# Patient Record
Sex: Female | Born: 1955 | Race: White | Hispanic: No | Smoking: Never smoker
Health system: Southern US, Community
[De-identification: ages and names within clinical notes are randomized; demographics above are authoritative.]

## PROBLEM LIST (undated history)

## (undated) DIAGNOSIS — Z9889 Other specified postprocedural states: Secondary | ICD-10-CM

## (undated) DIAGNOSIS — F32A Depression, unspecified: Secondary | ICD-10-CM

## (undated) DIAGNOSIS — R112 Nausea with vomiting, unspecified: Secondary | ICD-10-CM

## (undated) DIAGNOSIS — F419 Anxiety disorder, unspecified: Secondary | ICD-10-CM

## (undated) DIAGNOSIS — C801 Malignant (primary) neoplasm, unspecified: Secondary | ICD-10-CM

## (undated) HISTORY — PX: APPENDECTOMY: SHX54

## (undated) HISTORY — PX: HERNIA REPAIR: SHX51

## (undated) HISTORY — DX: Malignant (primary) neoplasm, unspecified: C80.1

## (undated) HISTORY — PX: ABDOMINAL HYSTERECTOMY: SHX81

## (undated) HISTORY — PX: REPLACEMENT TOTAL KNEE: SUR1224

## (undated) HISTORY — PX: TONSILLECTOMY: SUR1361

---

## 2008-10-19 HISTORY — PX: INCISIONAL HERNIA REPAIR: SHX193

## 2020-11-04 ENCOUNTER — Ambulatory Visit: Payer: Self-pay | Admitting: Internal Medicine

## 2020-11-08 ENCOUNTER — Other Ambulatory Visit: Payer: Self-pay

## 2020-11-08 ENCOUNTER — Encounter: Payer: Self-pay | Admitting: Internal Medicine

## 2020-11-08 ENCOUNTER — Ambulatory Visit (INDEPENDENT_AMBULATORY_CARE_PROVIDER_SITE_OTHER): Payer: Medicare Other | Admitting: Internal Medicine

## 2020-11-08 VITALS — BP 113/73 | HR 78 | Temp 97.3°F | Ht 63.0 in | Wt 165.0 lb

## 2020-11-08 DIAGNOSIS — Z96652 Presence of left artificial knee joint: Secondary | ICD-10-CM | POA: Diagnosis not present

## 2020-11-08 DIAGNOSIS — K432 Incisional hernia without obstruction or gangrene: Secondary | ICD-10-CM

## 2020-11-08 DIAGNOSIS — Z7689 Persons encountering health services in other specified circumstances: Secondary | ICD-10-CM | POA: Diagnosis not present

## 2020-11-08 DIAGNOSIS — Z8543 Personal history of malignant neoplasm of ovary: Secondary | ICD-10-CM | POA: Diagnosis not present

## 2020-11-08 DIAGNOSIS — F419 Anxiety disorder, unspecified: Secondary | ICD-10-CM | POA: Diagnosis not present

## 2020-11-08 DIAGNOSIS — Z2821 Immunization not carried out because of patient refusal: Secondary | ICD-10-CM

## 2020-11-08 NOTE — Assessment & Plan Note (Signed)
Around 2010 S/p hysterectomy and ovarian resection Had metastatic disease according to history Had chemotherapy for 1 year 

## 2020-11-08 NOTE — Progress Notes (Signed)
New Patient Office Visit  Subjective:  Patient ID: Evelyn Le, female    DOB: July 14, 1956  Age: 65 y.o. MRN: [id number]  CC:  Chief Complaint  Patient presents with  . New Patient (Initial Visit)    Here to establish care. No complaints today.    HPI Evelyn Le is a 65 year old female with PMH of ovarian cancer s/p resection and chemotherapy, postoperative incisional hernia s/p repair and anxiety who presents for establishing care. She recently moved from [place]. Her husband is present during the visit, who also had a visit today.  She has a h/o anxiety and takes Lexapro and Ativan. She states that she had ovarian cancer around 10 years ago, and since then, she has had anxiety. She denies depressed mood, suicidal or homicidal ideation.  She has had colonoscopy about 10 years ago.  She denies screening exams - Mammography and vaccines for now.  Past Medical History:  Diagnosis Date  . Cancer [facility])     Past Surgical History:  Procedure Laterality Date  . ABDOMINAL HYSTERECTOMY    . REPLACEMENT TOTAL KNEE      History reviewed. No pertinent family history.  Social History   Socioeconomic History  . Marital status: Married    Spouse name: Not on file  . Number of children: Not on file  . Years of education: Not on file  . Highest education level: Not on file  Occupational History  . Not on file  Tobacco Use  . Smoking status: Never Smoker  . Smokeless tobacco: Never Used  Substance and Sexual Activity  . Alcohol use: Not on file  . Drug use: Not on file  . Sexual activity: Not on file  Other Topics Concern  . Not on file  Social History Narrative  . Not on file   Social Determinants of Health   Financial Resource Strain: Not on file  Food Insecurity: Not on file  Transportation Needs: Not on file  Physical Activity: Not on file  Stress: Not on file  Social Connections: Not on file  Intimate Partner Violence: Not on file     ROS Review of Systems  Constitutional: Negative for chills and fever.  HENT: Negative for congestion, sinus pressure, sinus pain and sore throat.   Eyes: Negative for pain and discharge.  Respiratory: Negative for cough and shortness of breath.   Cardiovascular: Negative for chest pain and palpitations.  Gastrointestinal: Negative for abdominal pain, constipation, diarrhea, nausea and vomiting.  Endocrine: Negative for polydipsia and polyuria.  Genitourinary: Negative for dysuria and hematuria.  Musculoskeletal: Negative for neck pain and neck stiffness.  Skin: Negative for rash.  Neurological: Negative for dizziness and weakness.  Psychiatric/Behavioral: Negative for agitation, behavioral problems and suicidal ideas. The patient is nervous/anxious.     Objective:   Today's Vitals: BP 113/73 (BP Location: Right Arm, Patient Position: Sitting, Cuff Size: Normal)   Pulse 78   Temp (!) 97.3 F (36.3 C) (Temporal)   Ht 5\' 3"  (1.6 m)   Wt 165 lb (74.8 kg)   SpO2 97%   BMI 29.23 kg/m   Physical Exam Vitals reviewed.  Constitutional:      General: She is not in acute distress.    Appearance: She is not diaphoretic.  HENT:     Head: Normocephalic and atraumatic.     Nose: Nose normal.     Mouth/Throat:     Mouth: Mucous membranes are moist.  Eyes:     General: No scleral icterus.  Extraocular Movements: Extraocular movements intact.     Pupils: Pupils are equal, round, and reactive to light.  Cardiovascular:     Rate and Rhythm: Normal rate and regular rhythm.     Pulses: Normal pulses.     Heart sounds: No murmur heard.   Pulmonary:     Breath sounds: Normal breath sounds. No wheezing or rales.  Abdominal:     Palpations: Abdomen is soft.     Tenderness: There is no abdominal tenderness.  Musculoskeletal:     Cervical back: Neck supple. No tenderness.     Right lower leg: No edema.     Left lower leg: No edema.  Skin:    General: Skin is warm.     Findings:  No rash.  Neurological:     General: No focal deficit present.     Mental Status: She is alert and oriented to person, place, and time.  Psychiatric:        Mood and Affect: Mood normal.        Behavior: Behavior normal.     Assessment & Plan:   Problem List Items Addressed This Visit      Other   Encounter to establish care - Primary    Care established Previous chart reviewed History and medications reviewed with the patient      Anxiety    On Lexapro and Ativan Has been on Ativan for about 10 years, takes about 2-3 times in a week      Relevant Medications   escitalopram (LEXAPRO) 20 MG tablet   LORazepam (ATIVAN) 0.5 MG tablet   History of ovarian cancer    Around 2010 S/p hysterectomy and ovarian resection Had metastatic disease according to history Had chemotherapy for 1 year      History of total knee arthroplasty, left    For left knee arthritis No acute complaints      Postoperative incisional hernia    S/p hernia repair No acute complaints currently       Other Visit Diagnoses    Influenza vaccine refused         Refused Mammography and Pneumonia and Shingrix vaccines.  Outpatient Encounter Medications as of 11/08/2020  Medication Sig  . escitalopram (LEXAPRO) 20 MG tablet Take 20 mg by mouth daily.  Marland Kitchen LORazepam (ATIVAN) 0.5 MG tablet Take 0.25 mg by mouth once as needed for anxiety. Take 1/2 tab once daily PRN.   No facility-administered encounter medications on file as of 11/08/2020.    Follow-up: Return in about 4 months (around 03/08/2021).   Lindell Spar, MD

## 2020-11-08 NOTE — Assessment & Plan Note (Signed)
On Lexapro and Ativan Has been on Ativan for about 10 years, takes about 2-3 times in a week

## 2020-11-08 NOTE — Assessment & Plan Note (Signed)
Care established Previous chart reviewed History and medications reviewed with the patient 

## 2020-11-08 NOTE — Assessment & Plan Note (Signed)
S/p hernia repair No acute complaints currently

## 2020-11-08 NOTE — Patient Instructions (Signed)
Please continue to take medications as prescribed.  Please follow heart healthy diet and perform moderate exercise/walking as tolerated, at least 150 mins/week.  You are strongly advised to consider getting screening Mammography. If you decide to proceed, please contact us.

## 2020-11-08 NOTE — Assessment & Plan Note (Signed)
For left knee arthritis No acute complaints

## 2020-11-15 ENCOUNTER — Other Ambulatory Visit: Payer: Self-pay

## 2020-11-15 ENCOUNTER — Ambulatory Visit (INDEPENDENT_AMBULATORY_CARE_PROVIDER_SITE_OTHER): Payer: Medicare Other

## 2020-11-15 VITALS — Ht 63.0 in | Wt 160.0 lb

## 2020-11-15 DIAGNOSIS — Z0000 Encounter for general adult medical examination without abnormal findings: Secondary | ICD-10-CM

## 2020-11-15 NOTE — Patient Instructions (Addendum)
[name] , Thank you for taking time to come for your Medicare Wellness Visit. I appreciate your ongoing commitment to your health goals. Please review the following plan we discussed and let me know if I can assist you in the future.   Screening recommendations/referrals: Colonoscopy: Declined Mammogram: Declined Bone Density: Declined Recommended yearly ophthalmology/optometry visit for glaucoma screening and checkup Recommended yearly dental visit for hygiene and checkup  Vaccinations: Influenza vaccine: Fall 2022 Pneumococcal vaccine: Declined Tdap vaccine: Declined Shingles vaccine: Declined  Advanced directives: No  Conditions/risks identified: None  Next appointment: 03/14/21 @ 8 am   Preventive Care 65 Years and Older, Female Preventive care refers to lifestyle choices and visits with your health care provider that can promote health and wellness. What does preventive care include?  A yearly physical exam. This is also called an annual well check.  Dental exams once or twice a year.  Routine eye exams. Ask your health care provider how often you should have your eyes checked.  Personal lifestyle choices, including:  Daily care of your teeth and gums.  Regular physical activity.  Eating a healthy diet.  Avoiding tobacco and drug use.  Limiting alcohol use.  Practicing safe sex.  Taking low-dose aspirin every day.  Taking vitamin and mineral supplements as recommended by your health care provider. What happens during an annual well check? The services and screenings done by your health care provider during your annual well check will depend on your age, overall health, lifestyle risk factors, and family history of disease. Counseling  Your health care provider may ask you questions about your:  Alcohol use.  Tobacco use.  Drug use.  Emotional well-being.  Home and relationship well-being.  Sexual activity.  Eating habits.  History of  falls.  Memory and ability to understand (cognition).  Work and work Statistician.  Reproductive health. Screening  You may have the following tests or measurements:  Height, weight, and BMI.  Blood pressure.  Lipid and cholesterol levels. These may be checked every 5 years, or more frequently if you are over 76 years old.  Skin check.  Lung cancer screening. You may have this screening every year starting at age 29 if you have a 30-pack-year history of smoking and currently smoke or have quit within the past 15 years.  Fecal occult blood test (FOBT) of the stool. You may have this test every year starting at age 69.  Flexible sigmoidoscopy or colonoscopy. You may have a sigmoidoscopy every 5 years or a colonoscopy every 10 years starting at age 4.  Hepatitis C blood test.  Hepatitis B blood test.  Sexually transmitted disease (STD) testing.  Diabetes screening. This is done by checking your blood sugar (glucose) after you have not eaten for a while (fasting). You may have this done every 1-3 years.  Bone density scan. This is done to screen for osteoporosis. You may have this done starting at age 40.  Mammogram. This may be done every 1-2 years. Talk to your health care provider about how often you should have regular mammograms. Talk with your health care provider about your test results, treatment options, and if necessary, the need for more tests. Vaccines  Your health care provider may recommend certain vaccines, such as:  Influenza vaccine. This is recommended every year.  Tetanus, diphtheria, and acellular pertussis (Tdap, Td) vaccine. You may need a Td booster every 10 years.  Zoster vaccine. You may need this after age 18.  Pneumococcal 13-valent conjugate (PCV13)  vaccine. One dose is recommended after age 86.  Pneumococcal polysaccharide (PPSV23) vaccine. One dose is recommended after age 63. Talk to your health care provider about which screenings and  vaccines you need and how often you need them. This information is not intended to replace advice given to you by your health care provider. Make sure you discuss any questions you have with your health care provider. Document Released: 11/01/2015 Document Revised: 06/24/2016 Document Reviewed: 08/06/2015 Elsevier Interactive Patient Education  2017 [place] Prevention in the Home Falls can cause injuries. They can happen to people of all ages. There are many things you can do to make your home safe and to help prevent falls. What can I do on the outside of my home?  Regularly fix the edges of walkways and driveways and fix any cracks.  Remove anything that might make you trip as you walk through a door, such as a raised step or threshold.  Trim any bushes or trees on the path to your home.  Use bright outdoor lighting.  Clear any walking paths of anything that might make someone trip, such as rocks or tools.  Regularly check to see if handrails are loose or broken. Make sure that both sides of any steps have handrails.  Any raised decks and porches should have guardrails on the edges.  Have any leaves, snow, or ice cleared regularly.  Use sand or salt on walking paths during winter.  Clean up any spills in your garage right away. This includes oil or grease spills. What can I do in the bathroom?  Use night lights.  Install grab bars by the toilet and in the tub and shower. Do not use towel bars as grab bars.  Use non-skid mats or decals in the tub or shower.  If you need to sit down in the shower, use a plastic, non-slip stool.  Keep the floor dry. Clean up any water that spills on the floor as soon as it happens.  Remove soap buildup in the tub or shower regularly.  Attach bath mats securely with double-sided non-slip rug tape.  Do not have throw rugs and other things on the floor that can make you trip. What can I do in the bedroom?  Use night  lights.  Make sure that you have a light by your bed that is easy to reach.  Do not use any sheets or blankets that are too big for your bed. They should not hang down onto the floor.  Have a firm chair that has side arms. You can use this for support while you get dressed.  Do not have throw rugs and other things on the floor that can make you trip. What can I do in the kitchen?  Clean up any spills right away.  Avoid walking on wet floors.  Keep items that you use a lot in easy-to-reach places.  If you need to reach something above you, use a strong step stool that has a grab bar.  Keep electrical cords out of the way.  Do not use floor polish or wax that makes floors slippery. If you must use wax, use non-skid floor wax.  Do not have throw rugs and other things on the floor that can make you trip. What can I do with my stairs?  Do not leave any items on the stairs.  Make sure that there are handrails on both sides of the stairs and use them. Fix handrails that are  broken or loose. Make sure that handrails are as long as the stairways.  Check any carpeting to make sure that it is firmly attached to the stairs. Fix any carpet that is loose or worn.  Avoid having throw rugs at the top or bottom of the stairs. If you do have throw rugs, attach them to the floor with carpet tape.  Make sure that you have a light switch at the top of the stairs and the bottom of the stairs. If you do not have them, ask someone to add them for you. What else can I do to help prevent falls?  Wear shoes that:  Do not have high heels.  Have rubber bottoms.  Are comfortable and fit you well.  Are closed at the toe. Do not wear sandals.  If you use a stepladder:  Make sure that it is fully opened. Do not climb a closed stepladder.  Make sure that both sides of the stepladder are locked into place.  Ask someone to hold it for you, if possible.  Clearly mark and make sure that you can  see:  Any grab bars or handrails.  First and last steps.  Where the edge of each step is.  Use tools that help you move around (mobility aids) if they are needed. These include:  Canes.  Walkers.  Scooters.  Crutches.  Turn on the lights when you go into a dark area. Replace any light bulbs as soon as they burn out.  Set up your furniture so you have a clear path. Avoid moving your furniture around.  If any of your floors are uneven, fix them.  If there are any pets around you, be aware of where they are.  Review your medicines with your doctor. Some medicines can make you feel dizzy. This can increase your chance of falling. Ask your doctor what other things that you can do to help prevent falls. This information is not intended to replace advice given to you by your health care provider. Make sure you discuss any questions you have with your health care provider. Document Released: 08/01/2009 Document Revised: 03/12/2016 Document Reviewed: 11/09/2014 Elsevier Interactive Patient Education  2017 [employer].

## 2020-11-15 NOTE — Progress Notes (Signed)
Subjective:   Evelyn Le is a 65 y.o. female who presents for Medicare Annual (Subsequent) preventive examination.  Review of Systems       Objective:    There were no vitals filed for this visit. There is no height or weight on file to calculate BMI.  No flowsheet data found.  Current Medications (verified) Outpatient Encounter Medications as of 11/15/2020  Medication Sig  . escitalopram (LEXAPRO) 20 MG tablet Take 20 mg by mouth daily.  Marland Kitchen LORazepam (ATIVAN) 0.5 MG tablet Take 0.25 mg by mouth once as needed for anxiety. Take 1/2 tab once daily PRN.   No facility-administered encounter medications on file as of 11/15/2020.    Allergies (verified) Penicillins   History: Past Medical History:  Diagnosis Date  . Cancer [facility])    Past Surgical History:  Procedure Laterality Date  . ABDOMINAL HYSTERECTOMY    . REPLACEMENT TOTAL KNEE     No family history on file. Social History   Socioeconomic History  . Marital status: Married    Spouse name: Not on file  . Number of children: Not on file  . Years of education: Not on file  . Highest education level: Not on file  Occupational History  . Not on file  Tobacco Use  . Smoking status: Never Smoker  . Smokeless tobacco: Never Used  Substance and Sexual Activity  . Alcohol use: Not on file  . Drug use: Not on file  . Sexual activity: Not on file  Other Topics Concern  . Not on file  Social History Narrative  . Not on file   Social Determinants of Health   Financial Resource Strain: Not on file  Food Insecurity: Not on file  Transportation Needs: Not on file  Physical Activity: Not on file  Stress: Not on file  Social Connections: Not on file    Tobacco Counseling Counseling given: Not Answered   Clinical Intake:                 Diabetic? no         Activities of Daily Living No flowsheet data found.  Patient Care Team: Lindell Spar, MD as PCP - General (Internal  Medicine)  Indicate any recent Medical Services you may have received from other than [facility] providers in the past year (date may be approximate).     Assessment:   This is a routine wellness examination for Evelyn Le.  Hearing/Vision screen No exam data present  Dietary issues and exercise activities discussed:    Goals   None    Depression Screen PHQ 2/9 Scores 11/08/2020  PHQ - 2 Score 0    Fall Risk Fall Risk  11/08/2020  Falls in the past year? 0  Number falls in past yr: 0  Injury with Fall? 0  Risk for fall due to : No Fall Risks  Follow up Falls evaluation completed    [place]:  Any stairs in or around the home? No  If so, are there any without handrails? No  Home free of loose throw rugs in walkways, pet beds, electrical cords, etc? Yes  Adequate lighting in your home to reduce risk of falls? Yes   ASSISTIVE DEVICES UTILIZED TO PREVENT FALLS:  Life alert? No  Use of a cane, walker or w/c? No  Grab bars in the bathroom? No  Shower chair or bench in shower? No  Elevated toilet seat or a handicapped toilet? No  TIMED UP AND GO:  Was the test performed? No .  Length of time to ambulate n/a   Cognitive Function:        Immunizations Immunization History  Administered Date(s) Administered  . Moderna Sars-Covid-2 Vaccination 01/17/2020, 02/14/2020, 09/18/2020    TDAP status: Due, Education has been provided regarding the importance of this vaccine. Advised may receive this vaccine at local pharmacy or Health Dept. Aware to provide a copy of the vaccination record if obtained from local pharmacy or Health Dept. Verbalized acceptance and understanding.  Flu Vaccine status: Declined, Education has been provided regarding the importance of this vaccine but patient still declined. Advised may receive this vaccine at local pharmacy or Health Dept. Aware to provide a copy of the vaccination record if obtained from local  pharmacy or Health Dept. Verbalized acceptance and understanding.  Pneumococcal vaccine status: Declined,  Education has been provided regarding the importance of this vaccine but patient still declined. Advised may receive this vaccine at local pharmacy or Health Dept. Aware to provide a copy of the vaccination record if obtained from local pharmacy or Health Dept. Verbalized acceptance and understanding.   Covid-19 vaccine status: Completed vaccines  Qualifies for Shingles Vaccine? Yes   Zostavax completed No   Shingrix Completed?: No.    Education has been provided regarding the importance of this vaccine. Patient has been advised to call insurance company to determine out of pocket expense if they have not yet received this vaccine. Advised may also receive vaccine at local pharmacy or Health Dept. Verbalized acceptance and understanding.  Screening Tests Health Maintenance  Topic Date Due  . Hepatitis C Screening  Never done  . TETANUS/TDAP  Never done  . COLONOSCOPY (Pts 45-43yrs Insurance coverage will need to be confirmed)  Never done  . MAMMOGRAM  Never done  . DEXA SCAN  Never done  . PNA vac Low Risk Adult (1 of 2 - PCV13) Never done  . INFLUENZA VACCINE  01/16/2021 (Originally 05/19/2020)  . COVID-19 Vaccine  Completed    Health Maintenance  Health Maintenance Due  Topic Date Due  . Hepatitis C Screening  Never done  . TETANUS/TDAP  Never done  . COLONOSCOPY (Pts 45-84yrs Insurance coverage will need to be confirmed)  Never done  . MAMMOGRAM  Never done  . DEXA SCAN  Never done  . PNA vac Low Risk Adult (1 of 2 - PCV13) Never done    Colorectal cancer screening: Type of screening: Colonoscopy. Completed 2010. Repeat every 10 years  Mammogram: Declined  Bone Dexa: Declined  Lung Cancer Screening: (Low Dose CT Chest recommended if Age 50-80 years, 30 pack-year currently smoking OR have quit w/in 15years.) does not qualify.   Lung Cancer Screening Referral:  n/a  Additional Screening:  Hepatitis C Screening: does not qualify; Completed  Vision Screening: Recommended annual ophthalmology exams for early detection of glaucoma and other disorders of the eye. Is the patient up to date with their annual eye exam?  No  Who is the provider or what is the name of the office in which the patient attends annual eye exams? n/a If pt is not established with a provider, would they like to be referred to a provider to establish care? No .   Dental Screening: Recommended annual dental exams for proper oral hygiene  Community Resource Referral / Chronic Care Management: CRR required this visit?  No   CCM required this visit?  No      Plan:  I have personally reviewed and noted the following in the patient's chart:   . Medical and social history . Use of alcohol, tobacco or illicit drugs  . Current medications and supplements . Functional ability and status . Nutritional status . Physical activity . Advanced directives . List of other physicians . Hospitalizations, surgeries, and ER visits in previous 12 months . Vitals . Screenings to include cognitive, depression, and falls . Referrals and appointments  In addition, I have reviewed and discussed with patient certain preventive protocols, quality metrics, and best practice recommendations. A written personalized care plan for preventive services as well as general preventive health recommendations were provided to patient.     Laretta Bolster, [place]   075-GRM   Nurse Notes: AWV conducted by nurse in office by phone. Patient gave consent to telehealth visit via audio. Patient at home at the time of this visit. Provider in the office at the time of this visit. Visit took 25 minutes to complete.

## 2021-01-27 ENCOUNTER — Other Ambulatory Visit: Payer: Self-pay | Admitting: Internal Medicine

## 2021-01-27 ENCOUNTER — Telehealth: Payer: Self-pay

## 2021-01-27 ENCOUNTER — Other Ambulatory Visit: Payer: Self-pay | Admitting: *Deleted

## 2021-01-27 DIAGNOSIS — F419 Anxiety disorder, unspecified: Secondary | ICD-10-CM

## 2021-01-27 MED ORDER — ESCITALOPRAM OXALATE 20 MG PO TABS: 20.0000 mg | ORAL_TABLET | Freq: Every day | ORAL | 0 refills | Status: DC

## 2021-01-27 MED ORDER — LORAZEPAM 0.5 MG PO TABS: 0.2500 mg | ORAL_TABLET | Freq: Once | ORAL | 0 refills | Status: DC | PRN

## 2021-01-27 NOTE — Telephone Encounter (Signed)
Lexapro sent to pharmacy would you like to refill ativan?

## 2021-01-27 NOTE — Telephone Encounter (Signed)
Please call this in [facility] in [place]

## 2021-02-28 ENCOUNTER — Telehealth: Payer: Self-pay

## 2021-02-28 ENCOUNTER — Other Ambulatory Visit: Payer: Self-pay | Admitting: Internal Medicine

## 2021-02-28 ENCOUNTER — Other Ambulatory Visit: Payer: Self-pay

## 2021-02-28 MED ORDER — ESCITALOPRAM OXALATE 20 MG PO TABS: 1.0000 | ORAL_TABLET | Freq: Every day | ORAL | 0 refills | Status: DC

## 2021-02-28 NOTE — Telephone Encounter (Signed)
Rx sent 

## 2021-02-28 NOTE — Telephone Encounter (Signed)
Patient called needs refill on lexapro 20mg  and request 90 day supply per insurance... uses Product/process development scientist

## 2021-03-14 ENCOUNTER — Encounter: Payer: Self-pay | Admitting: Internal Medicine

## 2021-03-14 ENCOUNTER — Other Ambulatory Visit: Payer: Self-pay

## 2021-03-14 ENCOUNTER — Ambulatory Visit (INDEPENDENT_AMBULATORY_CARE_PROVIDER_SITE_OTHER): Payer: Medicare Other | Admitting: Internal Medicine

## 2021-03-14 VITALS — BP 103/71 | HR 72 | Temp 98.3°F | Resp 18 | Ht 63.0 in | Wt 164.4 lb

## 2021-03-14 DIAGNOSIS — Z1159 Encounter for screening for other viral diseases: Secondary | ICD-10-CM

## 2021-03-14 DIAGNOSIS — Z1329 Encounter for screening for other suspected endocrine disorder: Secondary | ICD-10-CM

## 2021-03-14 DIAGNOSIS — F419 Anxiety disorder, unspecified: Secondary | ICD-10-CM

## 2021-03-14 DIAGNOSIS — E559 Vitamin D deficiency, unspecified: Secondary | ICD-10-CM

## 2021-03-14 DIAGNOSIS — Z114 Encounter for screening for human immunodeficiency virus [HIV]: Secondary | ICD-10-CM

## 2021-03-14 DIAGNOSIS — Z5320 Procedure and treatment not carried out because of patient's decision for unspecified reasons: Secondary | ICD-10-CM

## 2021-03-14 DIAGNOSIS — Z780 Asymptomatic menopausal state: Secondary | ICD-10-CM

## 2021-03-14 DIAGNOSIS — E663 Overweight: Secondary | ICD-10-CM

## 2021-03-14 MED ORDER — LORAZEPAM 0.5 MG PO TABS: 0.2500 mg | ORAL_TABLET | Freq: Once | ORAL | 1 refills | Status: DC | PRN

## 2021-03-14 NOTE — Assessment & Plan Note (Addendum)
Well-controlled with Lexapro and Ativan Has been on Ativan for about 10 years, takes about 2-3 times in a week

## 2021-03-14 NOTE — Progress Notes (Signed)
Established Patient Office Visit  Subjective:  Patient ID: Evelyn Le, female    DOB: 07/06/1956  Age: 65 y.o. MRN: [id number]  CC:  Chief Complaint  Patient presents with  . Follow-up    4 month follow up     HPI Evelyn Le  is a 65 year old female with PMH of ovarian cancer s/p resection and chemotherapy, postoperative incisional hernia s/p repair and anxiety who presents for follow up of her chronic medical conditions.  She has been doing well overall. Her anxiety is well-controlled. Requests refill of Ativan, which she takes PRN.  She still denies Mammography and Colonoscopy.  Past Medical History:  Diagnosis Date  . Cancer [facility])     Past Surgical History:  Procedure Laterality Date  . ABDOMINAL HYSTERECTOMY    . REPLACEMENT TOTAL KNEE      History reviewed. No pertinent family history.  Social History   Socioeconomic History  . Marital status: Married    Spouse name: Not on file  . Number of children: Not on file  . Years of education: Not on file  . Highest education level: Not on file  Occupational History  . Not on file  Tobacco Use  . Smoking status: Never Smoker  . Smokeless tobacco: Never Used  Substance and Sexual Activity  . Alcohol use: Not on file  . Drug use: Not on file  . Sexual activity: Not on file  Other Topics Concern  . Not on file  Social History Narrative  . Not on file   Social Determinants of Health   Financial Resource Strain: Low Risk   . Difficulty of Paying Living Expenses: Not hard at all  Food Insecurity: No Food Insecurity  . Worried About Charity fundraiser in the Last Year: Never true  . Ran Out of Food in the Last Year: Never true  Transportation Needs: No Transportation Needs  . Lack of Transportation (Medical): No  . Lack of Transportation (Non-Medical): No  Physical Activity: Insufficiently Active  . Days of Exercise per Week: 3 days  . Minutes of Exercise per Session: 30 min  Stress: No  Stress Concern Present  . Feeling of Stress : Not at all  Social Connections: Moderately Isolated  . Frequency of Communication with Friends and Family: More than three times a week  . Frequency of Social Gatherings with Friends and Family: Once a week  . Attends Religious Services: Never  . Active Member of Clubs or Organizations: No  . Attends Archivist Meetings: Never  . Marital Status: Married  Human resources officer Violence: Not At Risk  . Fear of Current or Ex-Partner: No  . Emotionally Abused: No  . Physically Abused: No  . Sexually Abused: No    Outpatient Medications Prior to Visit  Medication Sig Dispense Refill  . escitalopram (LEXAPRO) 20 MG tablet Take 1 tablet (20 mg total) by mouth daily. 90 tablet 0  . LORazepam (ATIVAN) 0.5 MG tablet Take 0.5 tablets (0.25 mg total) by mouth once as needed for anxiety. 30 tablet 0   No facility-administered medications prior to visit.    Allergies  Allergen Reactions  . Penicillins     ROS Review of Systems  Constitutional: Negative for chills and fever.  HENT: Negative for congestion, sinus pressure, sinus pain and sore throat.   Eyes: Negative for pain and discharge.  Respiratory: Negative for cough and shortness of breath.   Cardiovascular: Negative for chest pain and palpitations.  Gastrointestinal: Negative for abdominal  pain, constipation, diarrhea, nausea and vomiting.  Endocrine: Negative for polydipsia and polyuria.  Genitourinary: Negative for dysuria and hematuria.  Musculoskeletal: Negative for neck pain and neck stiffness.  Skin: Negative for rash.  Neurological: Negative for dizziness and weakness.  Psychiatric/Behavioral: Negative for agitation, behavioral problems and suicidal ideas. The patient is nervous/anxious.       Objective:    Physical Exam Vitals reviewed.  Constitutional:      General: She is not in acute distress.    Appearance: She is not diaphoretic.  HENT:     Head:  Normocephalic and atraumatic.     Nose: Nose normal.     Mouth/Throat:     Mouth: Mucous membranes are moist.  Eyes:     General: No scleral icterus.    Extraocular Movements: Extraocular movements intact.  Cardiovascular:     Rate and Rhythm: Normal rate and regular rhythm.     Pulses: Normal pulses.     Heart sounds: No murmur heard.   Pulmonary:     Breath sounds: Normal breath sounds. No wheezing or rales.  Musculoskeletal:     Cervical back: Neck supple. No tenderness.     Right lower leg: No edema.     Left lower leg: No edema.  Skin:    General: Skin is warm.     Findings: No rash.  Neurological:     General: No focal deficit present.     Mental Status: She is alert and oriented to person, place, and time.  Psychiatric:        Mood and Affect: Mood normal.        Behavior: Behavior normal.     BP 103/71 (BP Location: Left Arm, Patient Position: Sitting, Cuff Size: Normal)   Pulse 72   Temp 98.3 F (36.8 C) (Oral)   Resp 18   Ht _0  (1.6 m)   Wt 164 lb 6.4 oz (74.6 kg)   SpO2 98%   BMI 29.12 kg/m  Wt Readings from Last 3 Encounters:  03/14/21 164 lb 6.4 oz (74.6 kg)  11/15/20 160 lb (72.6 kg)  11/08/20 165 lb (74.8 kg)     Health Maintenance Due  Topic Date Due  . Hepatitis C Screening  Never done  . TETANUS/TDAP  Never done  . COLONOSCOPY (Pts 45-55yr Insurance coverage will need to be confirmed)  Never done  . MAMMOGRAM  Never done  . Zoster Vaccines- Shingrix (1 of 2) Never done  . DEXA SCAN  Never done  . PNA vac Low Risk Adult (1 of 2 - PCV13) Never done    There are no preventive care reminders to display for this patient.  No results found for: TSH No results found for: WBC, HGB, HCT, MCV, PLT No results found for: NA, K, CHLORIDE, CO2, GLUCOSE, BUN, CREATININE, BILITOT, ALKPHOS, AST, ALT, PROT, ALBUMIN, CALCIUM, ANIONGAP, EGFR, GFR No results found for: CHOL No results found for: HDL No results found for: LDLCALC No results found  for: TRIG No results found for: CHOLHDL No results found for: HGBA1C    Assessment & Plan:   Problem List Items Addressed This Visit      Other   Anxiety - Primary    Well-controlled with Lexapro and Ativan Has been on Ativan for about 10 years, takes about 2-3 times in a week      Relevant Medications   LORazepam (ATIVAN) 0.5 MG tablet    Other Visit Diagnoses    Post-menopausal  Relevant Orders   DG Bone Density   Need for hepatitis C screening test       Relevant Orders   Hepatitis C Antibody   Screening for HIV (human immunodeficiency virus)       Screening for thyroid disorder       Relevant Orders   TSH   Vitamin D deficiency       Relevant Orders   Vitamin D (25 hydroxy)   Over weight       Relevant Orders   CBC   CMP14+EGFR   Lipid panel   Colonoscopy refused       Mammogram declined          Meds ordered this encounter  Medications  . LORazepam (ATIVAN) 0.5 MG tablet    Sig: Take 0.5 tablets (0.25 mg total) by mouth once as needed for anxiety.    Dispense:  30 tablet    Refill:  1    Follow-up: Return in about 6 months (around 09/14/2021) for Anxiety.    Lindell Spar, MD

## 2021-03-14 NOTE — Patient Instructions (Signed)
Please continue taking medications as prescribed.  Please consider getting Pneumonia and Shingrix vaccine at your local Pharmacy.

## 2021-03-21 ENCOUNTER — Ambulatory Visit (HOSPITAL_COMMUNITY)
Admission: RE | Admit: 2021-03-21 | Discharge: 2021-03-21 | Disposition: A | Discharge status: Home / Self Care | Payer: Medicare Other | Source: Ambulatory Visit | Attending: Internal Medicine | Admitting: Internal Medicine

## 2021-03-21 ENCOUNTER — Other Ambulatory Visit: Payer: Self-pay | Admitting: Internal Medicine

## 2021-03-21 DIAGNOSIS — Z780 Asymptomatic menopausal state: Secondary | ICD-10-CM | POA: Diagnosis not present

## 2021-03-21 DIAGNOSIS — M810 Age-related osteoporosis without current pathological fracture: Secondary | ICD-10-CM | POA: Insufficient documentation

## 2021-03-21 LAB — HEPATITIS C ANTIBODY: Hep C Virus Ab: <0.1 s/co ratio (ref 0.0–0.9)

## 2021-03-21 LAB — CBC
Hematocrit: 42.7 % (ref 34.0–46.6)
Hemoglobin: 14.1 g/dL (ref 11.1–15.9)
MCH: 27.2 pg (ref 26.6–33.0)
MCHC: 33 g/dL (ref 31.5–35.7)
MCV: 82 fL (ref 79–97)
Platelets: 296 10*3/uL (ref 150–450)
RBC: 5.19 x10E6/uL (ref 3.77–5.28)
RDW: 12.6 % (ref 11.7–15.4)
WBC: 5.4 10*3/uL (ref 3.4–10.8)

## 2021-03-21 LAB — LIPID PANEL
Chol/HDL Ratio: 3.7 ratio (ref 0.0–4.4)
Cholesterol, Total: 229 mg/dL — ABNORMAL HIGH (ref 100–199)
HDL: 62 mg/dL (ref >39)
LDL Chol Calc (NIH): 151 mg/dL — ABNORMAL HIGH (ref 0–99)
Triglycerides: 89 mg/dL (ref 0–149)
VLDL Cholesterol Cal: 16 mg/dL (ref 5–40)

## 2021-03-21 LAB — CMP14+EGFR
ALT: 9 IU/L (ref 0–32)
AST: 14 IU/L (ref 0–40)
Albumin/Globulin Ratio: 2.1 (ref 1.2–2.2)
Albumin: 4.4 g/dL (ref 3.8–4.8)
Alkaline Phosphatase: 70 IU/L (ref 44–121)
BUN/Creatinine Ratio: 23 (ref 12–28)
BUN: 19 mg/dL (ref 8–27)
Bilirubin Total: 0.4 mg/dL (ref 0.0–1.2)
CO2: 21 mmol/L (ref 20–29)
Calcium: 9.4 mg/dL (ref 8.7–10.3)
Chloride: 105 mmol/L (ref 96–106)
Creatinine, Ser: 0.83 mg/dL (ref 0.57–1.00)
Globulin, Total: 2.1 g/dL (ref 1.5–4.5)
Glucose: 103 mg/dL — ABNORMAL HIGH (ref 65–99)
Potassium: 4.6 mmol/L (ref 3.5–5.2)
Sodium: 142 mmol/L (ref 134–144)
Total Protein: 6.5 g/dL (ref 6.0–8.5)
eGFR: 78 mL/min/{1.73_m2} (ref >59)

## 2021-03-21 LAB — TSH: TSH: 1.1 u[IU]/mL (ref 0.450–4.500)

## 2021-03-21 LAB — VITAMIN D 25 HYDROXY (VIT D DEFICIENCY, FRACTURES): Vit D, 25-Hydroxy: 21.5 ng/mL — ABNORMAL LOW (ref 30.0–100.0)

## 2021-03-21 MED ORDER — ALENDRONATE SODIUM 70 MG PO TABS: 70.0000 mg | ORAL_TABLET | ORAL | 11 refills | Status: DC

## 2021-06-24 ENCOUNTER — Other Ambulatory Visit: Payer: Self-pay | Admitting: Internal Medicine

## 2021-06-24 ENCOUNTER — Telehealth: Payer: Self-pay

## 2021-06-24 ENCOUNTER — Other Ambulatory Visit: Payer: Self-pay | Admitting: *Deleted

## 2021-06-24 MED ORDER — ESCITALOPRAM OXALATE 20 MG PO TABS: 20.0000 mg | ORAL_TABLET | Freq: Every day | ORAL | 0 refills | Status: DC

## 2021-06-24 NOTE — Telephone Encounter (Signed)
Patient needing refills on lexapro shes requesting a 90 day supply be sent to [facility] ph# [phone]

## 2021-06-24 NOTE — Telephone Encounter (Signed)
Patient medication sent to pharmacy  

## 2021-07-25 ENCOUNTER — Ambulatory Visit: Payer: Self-pay

## 2021-07-25 ENCOUNTER — Ambulatory Visit (INDEPENDENT_AMBULATORY_CARE_PROVIDER_SITE_OTHER): Payer: Medicare Other | Admitting: Sports Medicine

## 2021-07-25 ENCOUNTER — Other Ambulatory Visit: Payer: Self-pay

## 2021-07-25 ENCOUNTER — Ambulatory Visit (INDEPENDENT_AMBULATORY_CARE_PROVIDER_SITE_OTHER): Payer: Medicare Other

## 2021-07-25 VITALS — BP 110/80 | HR 75 | Ht 63.0 in | Wt 168.6 lb

## 2021-07-25 DIAGNOSIS — M25561 Pain in right knee: Secondary | ICD-10-CM | POA: Diagnosis not present

## 2021-07-25 DIAGNOSIS — M1711 Unilateral primary osteoarthritis, right knee: Secondary | ICD-10-CM

## 2021-07-25 NOTE — Progress Notes (Signed)
Evelyn Le Evelyn Le [place] Phone: [phone]   Assessment and Plan:     1. Acute pain of right knee 2. Osteoarthritis of right knee, unspecified osteoarthritis type -Acute on chronic, initial sports medicine visit - Likely acute flare of chronic osteoarthritis of right knee - Patient elects for CSI at today's visit.  Tolerated well per procedure note below - Start knee HEP - Knee x-ray obtained in clinic.  My interpretation: No acute fracture.  Severe degenerative changes in lateral compartment with nearly bone-on-bone.  Cortical irregularities over lateral tibia.  Calcification and posterior knee seen best on lateral view intentionally representing calcified Baker's cyst - DG Knee AP/LAT W/Sunrise Right; Future   Procedure: Knee Joint Injection Side: Right Indication: Acute knee pain  Risks explained and consent was given verbally. The site was cleaned with alcohol prep. A needle was introduced with an anterio-medial approach. Injection given using 35mL of 1% lidocaine without epinephrine and 92mL of kenalog 40mg /ml. This was well tolerated and resulted in symptomatic relief.  Needle was removed, hemostasis achieved, and post injection instructions were explained.   Pt was advised to call or return to clinic if these symptoms worsen or fail to improve as anticipated.   Pertinent previous records reviewed include none   Follow Up: 4 weeks for reevaluation.  Would consider HA injection versus formal PT at that time if no improvement or worsening of symptoms   Subjective:   I, Evelyn Le, am serving as a scribe for Dr. Glennon Le  Chief Complaint: right knee pain   HPI:   07/25/21 Patient is a 65 year old female presenting with right knee pain. Patient states that her Right knee has been hurting for the last year really bad. Patient locates pain to below the patella and the knee gets stuck all the  time. Patient has a sharp pain until the knee clicks and then the pain will usually go away.  Radiates: yes to the R shin  Swelling: no Mechanical symptoms: yes Aggravates: crossing legs, keeping leg bent, or keeping straight to long Treatments tried:    Relevant Historical Information: Left knee replaced in 12/11/2019 by Evelyn Le   Additional pertinent review of systems negative.   Current Outpatient Medications:    escitalopram (LEXAPRO) 20 MG tablet, Take 1 tablet (20 mg total) by mouth daily., Disp: 90 tablet, Rfl: 0   LORazepam (ATIVAN) 0.5 MG tablet, Take 0.5 tablets (0.25 mg total) by mouth once as needed for anxiety., Disp: 30 tablet, Rfl: 1   alendronate (FOSAMAX) 70 MG tablet, Take 1 tablet (70 mg total) by mouth every 7 (seven) days. Take with a full glass of water on an empty stomach. (Patient not taking: Reported on 07/25/2021), Disp: 4 tablet, Rfl: 11   Objective:     Vitals:   07/25/21 0918  BP: 110/80  Pulse: 75  SpO2: 97%  Weight: 168 lb 9.6 oz (76.5 kg)  Height: 5\' 3"  (1.6 m)      Body mass index is 29.87 kg/m.    Physical Exam:    General:  awake, alert oriented, no acute distress nontoxic Skin: no suspicious lesions or rashes Neuro:sensation intact, no deficits, strength 5/5 with no deficits, no atrophy, normal muscle tone Psych: No signs of anxiety, depression or other mood disorder  Right knee: No swelling Crepitus present No deformity Neg fluid wave, joint milking ROM Flex 100, Ext 15 TTP medial lateral joint line NTTP  over the quad tendon, medial fem condyle, lat fem condyle, patella, plica, patella tendon, tibial tuberostiy, fibular head, posterior fossa, pes anserine bursa, gerdy's tubercle, Neg anterior and posterior drawer Neg lachman Neg sag sign Negative varus stress Negative valgus stress Positive McMurray Positive Thessaly  Gait normal    Electronically signed by:  Evelyn Le D.[facility] 9:56 AM  07/25/21

## 2021-07-25 NOTE — Patient Instructions (Addendum)
Good to see you  Injection given today  Exercises given  See me again in 4 weeks

## 2021-08-06 ENCOUNTER — Telehealth: Payer: Self-pay | Admitting: Sports Medicine

## 2021-08-06 DIAGNOSIS — M1711 Unilateral primary osteoarthritis, right knee: Secondary | ICD-10-CM

## 2021-08-06 NOTE — Telephone Encounter (Signed)
Pt is interested in getting a knee replacement. Is appreciative of Dr. Marisue Brooklyn care but thinks she need to take a more aggressive approach.  Whom would we recommend in the area?

## 2021-08-07 ENCOUNTER — Ambulatory Visit: Payer: Medicare Other

## 2021-08-07 ENCOUNTER — Other Ambulatory Visit: Payer: Self-pay

## 2021-08-21 ENCOUNTER — Ambulatory Visit: Payer: Medicare Other | Admitting: Sports Medicine

## 2021-09-18 ENCOUNTER — Ambulatory Visit: Payer: Medicare Other | Admitting: Internal Medicine

## 2021-09-22 ENCOUNTER — Other Ambulatory Visit: Payer: Self-pay | Admitting: Internal Medicine

## 2021-09-22 DIAGNOSIS — F419 Anxiety disorder, unspecified: Secondary | ICD-10-CM

## 2021-10-09 ENCOUNTER — Encounter: Payer: Self-pay | Admitting: Internal Medicine

## 2021-10-09 ENCOUNTER — Ambulatory Visit (INDEPENDENT_AMBULATORY_CARE_PROVIDER_SITE_OTHER): Payer: Medicare Other | Admitting: Internal Medicine

## 2021-10-09 ENCOUNTER — Other Ambulatory Visit: Payer: Self-pay | Admitting: *Deleted

## 2021-10-09 ENCOUNTER — Other Ambulatory Visit: Payer: Self-pay

## 2021-10-09 VITALS — BP 108/62 | HR 69 | Resp 18 | Ht 63.0 in | Wt 172.0 lb

## 2021-10-09 DIAGNOSIS — Z8543 Personal history of malignant neoplasm of ovary: Secondary | ICD-10-CM | POA: Diagnosis not present

## 2021-10-09 DIAGNOSIS — E782 Mixed hyperlipidemia: Secondary | ICD-10-CM | POA: Diagnosis not present

## 2021-10-09 DIAGNOSIS — F419 Anxiety disorder, unspecified: Secondary | ICD-10-CM | POA: Diagnosis not present

## 2021-10-09 DIAGNOSIS — M810 Age-related osteoporosis without current pathological fracture: Secondary | ICD-10-CM

## 2021-10-09 DIAGNOSIS — E559 Vitamin D deficiency, unspecified: Secondary | ICD-10-CM

## 2021-10-09 DIAGNOSIS — Z23 Encounter for immunization: Secondary | ICD-10-CM | POA: Diagnosis not present

## 2021-10-09 DIAGNOSIS — R7303 Prediabetes: Secondary | ICD-10-CM

## 2021-10-09 MED ORDER — ESCITALOPRAM OXALATE 20 MG PO TABS: 20.0000 mg | ORAL_TABLET | Freq: Every day | ORAL | 0 refills | Status: DC

## 2021-10-09 MED ORDER — ESCITALOPRAM OXALATE 20 MG PO TABS: 20.0000 mg | ORAL_TABLET | Freq: Every day | ORAL | 3 refills | Status: DC

## 2021-10-09 NOTE — Assessment & Plan Note (Signed)
Last vitamin D Lab Results  Component Value Date   VD25OH 21.5 (L) 03/20/2021   Advised to take Vitamin D 2000 IU QD

## 2021-10-09 NOTE — Assessment & Plan Note (Signed)
Will check HbA1C in the next visit

## 2021-10-09 NOTE — Patient Instructions (Addendum)
Please continue taking medications as prescribed.  Please continue to take calcium and Vitamin D supplements for osteoporosis.  Please consider getting Shingrix vaccine at local pharmacy.  You were given PCV20 in the office today.

## 2021-10-09 NOTE — Assessment & Plan Note (Signed)
Well-controlled with Lexapro and Ativan Has been on Ativan for about 10 years, takes about 2-3 times in a week

## 2021-10-09 NOTE — Assessment & Plan Note (Signed)
Around 2010 S/p hysterectomy and ovarian resection Had metastatic disease according to history Had chemotherapy for 1 year 

## 2021-10-09 NOTE — Assessment & Plan Note (Signed)
Did not tolerate Fosamax, had tremors, balance problems and recurrent falls while taking it Continue calcium and Vitamin D supplements 

## 2021-10-09 NOTE — Progress Notes (Signed)
Established Patient Office Visit  Subjective:  Patient ID: Evelyn Le, female    DOB: 31-Dec-1955  Age: 65 y.o. MRN: [id number]  CC:  Chief Complaint  Patient presents with   Follow-up    6 month follow up     HPI Evelyn Le is a 65 y.o. female with past medical history of ovarian cancer s/p resection and chemotherapy, postoperative incisional hernia s/p repair and anxiety who presents for f/u of her chronic medical conditions.  Osteoporosis: She had severe tremors and balance problem with Fosamax.  She had recurrent falls while taking it.  She has stopped taking it now.  She states that her tremors have resolved now.  She still denies Mammography and Colonoscopy as she had to go through treatment for ovarian cancer, which was traumatic for her.  She received PCV20 in the office today.  Past Medical History:  Diagnosis Date   Cancer [facility])     Past Surgical History:  Procedure Laterality Date   ABDOMINAL HYSTERECTOMY     REPLACEMENT TOTAL KNEE      History reviewed. No pertinent family history.  Social History   Socioeconomic History   Marital status: Married    Spouse name: Not on file   Number of children: Not on file   Years of education: Not on file   Highest education level: Not on file  Occupational History   Not on file  Tobacco Use   Smoking status: Never   Smokeless tobacco: Never  Substance and Sexual Activity   Alcohol use: Not on file   Drug use: Not on file   Sexual activity: Not on file  Other Topics Concern   Not on file  Social History Narrative   Not on file   Social Determinants of Health   Financial Resource Strain: Low Risk    Difficulty of Paying Living Expenses: Not hard at all  Food Insecurity: No Food Insecurity   Worried About Running Out of Food in the Last Year: Never true   Ran Out of Food in the Last Year: Never true  Transportation Needs: No Transportation Needs   Lack of Transportation (Medical): No    Lack of Transportation (Non-Medical): No  Physical Activity: Insufficiently Active   Days of Exercise per Week: 3 days   Minutes of Exercise per Session: 30 min  Stress: No Stress Concern Present   Feeling of Stress : Not at all  Social Connections: Moderately Isolated   Frequency of Communication with Friends and Family: More than three times a week   Frequency of Social Gatherings with Friends and Family: Once a week   Attends Religious Services: Never   Database administrator or Organizations: No   Attends Engineer, structural: Never   Marital Status: Married  Catering manager Violence: Not At Risk   Fear of Current or Ex-Partner: No   Emotionally Abused: No   Physically Abused: No   Sexually Abused: No    Outpatient Medications Prior to Visit  Medication Sig Dispense Refill   LORazepam (ATIVAN) 0.5 MG tablet TAKE 1/2 (ONE-HALF) TABLET BY MOUTH ONCE AS NEEDED FOR ANXIETY 30 tablet 0   escitalopram (LEXAPRO) 20 MG tablet Take 1 tablet (20 mg total) by mouth daily. 90 tablet 0   alendronate (FOSAMAX) 70 MG tablet Take 1 tablet (70 mg total) by mouth every 7 (seven) days. Take with a full glass of water on an empty stomach. (Patient not taking: Reported on 07/25/2021) 4 tablet 11  No facility-administered medications prior to visit.    Allergies  Allergen Reactions   Fosamax [Alendronate] Other (See Comments)    Tremors    Penicillins     ROS Review of Systems  Constitutional:  Negative for chills and fever.  HENT:  Negative for congestion, sinus pressure, sinus pain and sore throat.   Eyes:  Negative for pain and discharge.  Respiratory:  Negative for cough and shortness of breath.   Cardiovascular:  Negative for chest pain and palpitations.  Gastrointestinal:  Negative for abdominal pain, constipation, diarrhea, nausea and vomiting.  Endocrine: Negative for polydipsia and polyuria.  Genitourinary:  Negative for dysuria and hematuria.  Musculoskeletal:   Negative for neck pain and neck stiffness.  Skin:  Negative for rash.  Neurological:  Negative for dizziness and weakness.  Psychiatric/Behavioral:  Negative for agitation, behavioral problems and suicidal ideas. The patient is nervous/anxious.      Objective:    Physical Exam Vitals reviewed.  Constitutional:      General: She is not in acute distress.    Appearance: She is not diaphoretic.  HENT:     Head: Normocephalic and atraumatic.     Nose: Nose normal.     Mouth/Throat:     Mouth: Mucous membranes are moist.  Eyes:     General: No scleral icterus.    Extraocular Movements: Extraocular movements intact.  Cardiovascular:     Rate and Rhythm: Normal rate and regular rhythm.     Pulses: Normal pulses.     Heart sounds: No murmur heard. Pulmonary:     Breath sounds: Normal breath sounds. No wheezing or rales.  Musculoskeletal:     Cervical back: Neck supple. No tenderness.     Right lower leg: No edema.     Left lower leg: No edema.  Skin:    General: Skin is warm.     Findings: No rash.  Neurological:     General: No focal deficit present.     Mental Status: She is alert and oriented to person, place, and time.     Sensory: No sensory deficit.     Motor: No weakness.  Psychiatric:        Mood and Affect: Mood normal.        Behavior: Behavior normal.    BP 108/62 (BP Location: Left Arm, Patient Position: Sitting, Cuff Size: Normal)    Pulse 69    Resp 18    Ht $R'5\' 3"'Hi$  (1.6 m)    Wt 172 lb (78 kg)    SpO2 97%    BMI 30.47 kg/m  Wt Readings from Last 3 Encounters:  10/09/21 172 lb (78 kg)  07/25/21 168 lb 9.6 oz (76.5 kg)  03/14/21 164 lb 6.4 oz (74.6 kg)    Lab Results  Component Value Date   TSH 1.100 03/20/2021   Lab Results  Component Value Date   WBC 5.4 03/20/2021   HGB 14.1 03/20/2021   HCT 42.7 03/20/2021   MCV 82 03/20/2021   PLT 296 03/20/2021   Lab Results  Component Value Date   NA 142 03/20/2021   K 4.6 03/20/2021   CO2 21 03/20/2021    GLUCOSE 103 (H) 03/20/2021   BUN 19 03/20/2021   CREATININE 0.83 03/20/2021   BILITOT 0.4 03/20/2021   ALKPHOS 70 03/20/2021   AST 14 03/20/2021   ALT 9 03/20/2021   PROT 6.5 03/20/2021   ALBUMIN 4.4 03/20/2021   CALCIUM 9.4 03/20/2021   EGFR 78  03/20/2021   Lab Results  Component Value Date   CHOL 229 (H) 03/20/2021   Lab Results  Component Value Date   HDL 62 03/20/2021   Lab Results  Component Value Date   LDLCALC 151 (H) 03/20/2021   Lab Results  Component Value Date   TRIG 89 03/20/2021   Lab Results  Component Value Date   CHOLHDL 3.7 03/20/2021   No results found for: HGBA1C    Assessment & Plan:   Problem List Items Addressed This Visit       Musculoskeletal and Integument   Age-related osteoporosis without current pathological fracture - Primary    Did not tolerate Fosamax, had tremors, balance problems and recurrent falls while taking it Continue calcium and Vitamin D supplements        Other   Anxiety    Well-controlled with Lexapro and Ativan Has been on Ativan for about 10 years, takes about 2-3 times in a week      Relevant Medications   escitalopram (LEXAPRO) 20 MG tablet   History of ovarian cancer    Around 2010 S/p hysterectomy and ovarian resection Had metastatic disease according to history Had chemotherapy for 1 year      Mixed hyperlipidemia    Lipid profile reviewed Advised to follow low cholesterol diet for now      Vitamin D deficiency    Last vitamin D Lab Results  Component Value Date   VD25OH 21.5 (L) 03/20/2021  Advised to take Vitamin D 2000 IU QD      Prediabetes    Will check HbA1C in the next visit      Other Visit Diagnoses     Need for viral immunization       Relevant Orders   Pneumococcal conjugate vaccine 20-valent (Prevnar 20)       Meds ordered this encounter  Medications   escitalopram (LEXAPRO) 20 MG tablet    Sig: Take 1 tablet (20 mg total) by mouth daily.    Dispense:  90 tablet     Refill:  3    Follow-up: Return in about 6 months (around 04/09/2022) for Anxiety and osteoporosis.    Lindell Spar, MD

## 2021-10-09 NOTE — Assessment & Plan Note (Signed)
Lipid profile reviewed Advised to follow low cholesterol diet for now 

## 2021-11-17 ENCOUNTER — Ambulatory Visit (INDEPENDENT_AMBULATORY_CARE_PROVIDER_SITE_OTHER): Payer: Medicare HMO

## 2021-11-17 ENCOUNTER — Other Ambulatory Visit: Payer: Self-pay

## 2021-11-17 DIAGNOSIS — Z0000 Encounter for general adult medical examination without abnormal findings: Secondary | ICD-10-CM | POA: Diagnosis not present

## 2021-11-17 NOTE — Progress Notes (Signed)
I connected with  Evelyn Le on 11/17/21 by a audio enabled telemedicine application and verified that I am speaking with the correct person using two identifiers.  Patient Location: Home  Provider Location: Home Office  I discussed the limitations of evaluation and management by telemedicine. The patient expressed understanding and agreed to proceed.  Subjective:   Evelyn Le is a 66 y.o. female who presents for Medicare Annual (Subsequent) preventive examination.  Review of Systems     Cardiac Risk Factors include: advanced age (>37men, >68 women);dyslipidemia     Objective:    Today's Vitals   11/17/21 1056  PainSc: 0-No pain   There is no height or weight on file to calculate BMI.  Advanced Directives 11/17/2021 11/15/2020  Does Patient Have a Medical Advance Directive? No No  Would patient like information on creating a medical advance directive? Yes (ED - Information included in AVS) No - Patient declined    Current Medications (verified) Outpatient Encounter Medications as of 11/17/2021  Medication Sig   escitalopram (LEXAPRO) 20 MG tablet Take 1 tablet (20 mg total) by mouth daily.   LORazepam (ATIVAN) 0.5 MG tablet TAKE 1/2 (ONE-HALF) TABLET BY MOUTH ONCE AS NEEDED FOR ANXIETY   No facility-administered encounter medications on file as of 11/17/2021.    Allergies (verified) Fosamax [alendronate] and Penicillins   History: Past Medical History:  Diagnosis Date   Cancer [facility])    Past Surgical History:  Procedure Laterality Date   ABDOMINAL HYSTERECTOMY     REPLACEMENT TOTAL KNEE     No family history on file. Social History   Socioeconomic History   Marital status: Married    Spouse name: Not on file   Number of children: Not on file   Years of education: Not on file   Highest education level: Not on file  Occupational History   Not on file  Tobacco Use   Smoking status: Never   Smokeless tobacco: Never  Substance and Sexual  Activity   Alcohol use: Not on file   Drug use: Not on file   Sexual activity: Not on file  Other Topics Concern   Not on file  Social History Narrative   Not on file   Social Determinants of Health   Financial Resource Strain: Low Risk    Difficulty of Paying Living Expenses: Not hard at all  Food Insecurity: No Food Insecurity   Worried About Running Out of Food in the Last Year: Never true   [place] in the Last Year: Never true  Transportation Needs: No Transportation Needs   Lack of Transportation (Medical): No   Lack of Transportation (Non-Medical): No  Physical Activity: Sufficiently Active   Days of Exercise per Week: 5 days   Minutes of Exercise per Session: 40 min  Stress: Not on file  Social Connections: Moderately Integrated   Frequency of Communication with Friends and Family: More than three times a week   Frequency of Social Gatherings with Friends and Family: More than three times a week   Attends Religious Services: More than 4 times per year   Active Member of [employer] or Organizations: No   Attends Music therapist: Never   Marital Status: Married    Tobacco Counseling Counseling given: Not Answered   Clinical Intake:  Pre-visit preparation completed: Yes  Pain : No/denies pain Pain Score: 0-No pain     Nutritional Status: BMI 25 -29 Overweight Diabetes: No  How often do you  need to have someone help you when you read instructions, pamphlets, or other written materials from your doctor or pharmacy?: 1 - Never What is the last grade level you completed in school?: college  Diabetic?no  Interpreter Needed?: No      Activities of Daily Living In your present state of health, do you have any difficulty performing the following activities: 11/17/2021  Hearing? N  Vision? N  Difficulty concentrating or making decisions? N  Walking or climbing stairs? N  Dressing or bathing? N  Doing errands, shopping? N  Preparing Food and  eating ? N  Using the Toilet? N  In the past six months, have you accidently leaked urine? N  Do you have problems with loss of bowel control? N  Managing your Medications? N  Managing your Finances? N  Housekeeping or managing your Housekeeping? N  Some recent data might be hidden    Patient Care Team: Lindell Spar, MD as PCP - General (Internal Medicine)  Indicate any recent Medical Services you may have received from other than [facility] providers in the past year (date may be approximate).     Assessment:   This is a routine wellness examination for Evelyn Le.  Hearing/Vision screen No results found.  Dietary issues and exercise activities discussed: Current Exercise Habits: Home exercise routine, Time (Minutes): 30, Frequency (Times/Week): 5, Weekly Exercise (Minutes/Week): 150, Intensity: Moderate   Goals Addressed   None    Depression Screen PHQ 2/9 Scores 10/09/2021 03/14/2021 11/15/2020 11/08/2020  PHQ - 2 Score 0 0 0 0    Fall Risk Fall Risk  11/17/2021 10/09/2021 10/09/2021 03/14/2021 11/15/2020  Falls in the past year? 1 1 0 0 0  Number falls in past yr: 0 1 0 0 0  Injury with Fall? 0 0 0 0 0  Risk for fall due to : - History of fall(s);Impaired mobility No Fall Risks No Fall Risks No Fall Risks  Follow up - Falls evaluation completed;Falls prevention discussed Falls evaluation completed Falls evaluation completed Falls evaluation completed    FALL RISK PREVENTION PERTAINING TO THE HOME:  Any stairs in or around the home? No  If so, are there any without handrails? No  Home free of loose throw rugs in walkways, pet beds, electrical cords, etc? Yes  Adequate lighting in your home to reduce risk of falls? Yes   ASSISTIVE DEVICES UTILIZED TO PREVENT FALLS:  Life alert? No  Use of a cane, walker or w/c? No  Grab bars in the bathroom? No  Shower chair or bench in shower? No  Elevated toilet seat or a handicapped toilet? No   TIMED UP AND GO:  Was the test  performed? No .      Cognitive Function:     6CIT Screen 11/17/2021 11/15/2020  What Year? 0 points 0 points  What month? 0 points 0 points  What time? 0 points 0 points  Count back from 20 0 points 0 points  Months in reverse 0 points 0 points  Repeat phrase 0 points -  Total Score 0 -    Immunizations Immunization History  Administered Date(s) Administered   Moderna Sars-Covid-2 Vaccination 01/17/2020, 02/14/2020, 09/18/2020   PNEUMOCOCCAL CONJUGATE-20 10/09/2021    TDAP status: Due, Education has been provided regarding the importance of this vaccine. Advised may receive this vaccine at local pharmacy or Health Dept. Aware to provide a copy of the vaccination record if obtained from local pharmacy or Health Dept. Verbalized acceptance and understanding.  Flu Vaccine status: Up to date  Pneumococcal vaccine status: Up to date  Covid-19 vaccine status: Completed vaccines  Qualifies for Shingles Vaccine? Yes   Zostavax completed No   Shingrix Completed?: No.    Education has been provided regarding the importance of this vaccine. Patient has been advised to call insurance company to determine out of pocket expense if they have not yet received this vaccine. Advised may also receive vaccine at local pharmacy or Health Dept. Verbalized acceptance and understanding.  Screening Tests Health Maintenance  Topic Date Due   TETANUS/TDAP  Never done   Zoster Vaccines- Shingrix (1 of 2) Never done   COVID-19 Vaccine (4 - Booster for Moderna series) 11/13/2020   INFLUENZA VACCINE  01/16/2022 (Originally 05/19/2021)   MAMMOGRAM  10/09/2022 (Originally 04/25/2004)   COLONOSCOPY (Pts 45-28yrs Insurance coverage will need to be confirmed)  10/09/2022 (Originally 04/26/1999)   Pneumonia Vaccine 69+ Years old  Completed   DEXA SCAN  Completed   Hepatitis C Screening  Completed   HPV VACCINES  Aged Out    Health Maintenance  Health Maintenance Due  Topic Date Due   TETANUS/TDAP  Never  done   Zoster Vaccines- Shingrix (1 of 2) Never done   COVID-19 Vaccine (4 - Booster for Moderna series) 11/13/2020    Colorectal cancer screening: No longer required.  Pt declines   Mammogram status: No longer required due to patient declines to have this done .  Bone Density status: Completed 2022. Results reflect: Bone density results: NORMAL. Repeat every 2 years.  Lung Cancer Screening: (Low Dose CT Chest recommended if Age 73-80 years, 30 pack-year currently smoking OR have quit w/in 15years.) does not qualify.   Lung Cancer Screening Referral: na  Additional Screening:  Hepatitis C Screening: does qualify; Completed   Vision Screening: Recommended annual ophthalmology exams for early detection of glaucoma and other disorders of the eye. Is the patient up to date with their annual eye exam?  No  Who is the provider or what is the name of the office in which the patient attends annual eye exams?  If pt is not established with a provider, would they like to be referred to a provider to establish care? No .   Dental Screening: Recommended annual dental exams for proper oral hygiene  Community Resource Referral / Chronic Care Management: CRR required this visit?  No   CCM required this visit?  No      Plan:     I have personally reviewed and noted the following in the patients chart:   Medical and social history Use of alcohol, tobacco or illicit drugs  Current medications and supplements including opioid prescriptions.  Functional ability and status Nutritional status Physical activity Advanced directives List of other physicians Hospitalizations, surgeries, and ER visits in previous 12 months Vitals Screenings to include cognitive, depression, and falls Referrals and appointments  In addition, I have reviewed and discussed with patient certain preventive protocols, quality metrics, and best practice recommendations. A written personalized care plan for  preventive services as well as general preventive health recommendations were provided to patient.     Kate Sable, LPN, LPN   04/17/5283   Nurse Notes:  Ms. Bolda , Thank you for taking time to come for your Medicare Wellness Visit. I appreciate your ongoing commitment to your health goals. Please review the following plan we discussed and let me know if I can assist you in the future.   These are the goals  we discussed:  Goals      DIET - INCREASE WATER INTAKE        This is a list of the screening recommended for you and due dates:  Health Maintenance  Topic Date Due   Tetanus Vaccine  Never done   Zoster (Shingles) Vaccine (1 of 2) Never done   COVID-19 Vaccine (4 - Booster for Moderna series) 11/13/2020   Flu Shot  01/16/2022*   Mammogram  10/09/2022*   Colon Cancer Screening  10/09/2022*   Pneumonia Vaccine  Completed   DEXA scan (bone density measurement)  Completed   Hepatitis C Screening: USPSTF Recommendation to screen - Ages 105-79 yo.  Completed   HPV Vaccine  Aged Out  *Topic was postponed. The date shown is not the original due date.

## 2021-11-17 NOTE — Patient Instructions (Addendum)
Evelyn Le , Thank you for taking time to come for your Medicare Wellness Visit. I appreciate your ongoing commitment to your health goals. Please review the following plan we discussed and let me know if I can assist you in the future.   These are the goals we discussed:  Goals      DIET - INCREASE WATER INTAKE     Prevent falls        This is a list of the screening recommended for you and due dates:  Health Maintenance  Topic Date Due   Tetanus Vaccine  Never done   Zoster (Shingles) Vaccine (1 of 2) Never done   COVID-19 Vaccine (4 - Booster for Moderna series) 11/13/2020   Flu Shot  01/16/2022*   Mammogram  10/09/2022*   Colon Cancer Screening  10/09/2022*   Pneumonia Vaccine  Completed   DEXA scan (bone density measurement)  Completed   Hepatitis C Screening: USPSTF Recommendation to screen - Ages 54-79 yo.  Completed   HPV Vaccine  Aged Out  *Topic was postponed. The date shown is not the original due date.    Health Maintenance, Female Adopting a healthy lifestyle and getting preventive care are important in promoting health and wellness. Ask your health care provider about: The right schedule for you to have regular tests and exams. Things you can do on your own to prevent diseases and keep yourself healthy. What should I know about diet, weight, and exercise? Eat a healthy diet  Eat a diet that includes plenty of vegetables, fruits, low-fat dairy products, and lean protein. Do not eat a lot of foods that are high in solid fats, added sugars, or sodium. Maintain a healthy weight Body mass index (BMI) is used to identify weight problems. It estimates body fat based on height and weight. Your health care provider can help determine your BMI and help you achieve or maintain a healthy weight. Get regular exercise Get regular exercise. This is one of the most important things you can do for your health. Most adults should: Exercise for at least 150 minutes each  week. The exercise should increase your heart rate and make you sweat (moderate-intensity exercise). Do strengthening exercises at least twice a week. This is in addition to the moderate-intensity exercise. Spend less time sitting. Even light physical activity can be beneficial. Watch cholesterol and blood lipids Have your blood tested for lipids and cholesterol at 68 years of age, then have this test every 5 years. Have your cholesterol levels checked more often if: Your lipid or cholesterol levels are high. You are older than 68 years of age. You are at high risk for heart disease. What should I know about cancer screening? Depending on your health history and family history, you may need to have cancer screening at various ages. This may include screening for: Breast cancer. Cervical cancer. Colorectal cancer. Skin cancer. Lung cancer. What should I know about heart disease, diabetes, and high blood pressure? Blood pressure and heart disease High blood pressure causes heart disease and increases the risk of stroke. This is more likely to develop in people who have high blood pressure readings or are overweight. Have your blood pressure checked: Every 3-5 years if you are 29-60 years of age. Every year if you are 40 years old or older. Diabetes Have regular diabetes screenings. This checks your fasting blood sugar level. Have the screening done: Once every three years after age 32 if you are at a normal  weight and have a low risk for diabetes. More often and at a younger age if you are overweight or have a high risk for diabetes. What should I know about preventing infection? Hepatitis B If you have a higher risk for hepatitis B, you should be screened for this virus. Talk with your health care provider to find out if you are at risk for hepatitis B infection. Hepatitis C Testing is recommended for: Everyone born from [birth year] through 1967. Anyone with known risk factors for hepatitis  C. Sexually transmitted infections (STIs) Get screened for STIs, including gonorrhea and chlamydia, if: You are sexually active and are younger than 68 years of age. You are older than 68 years of age and your health care provider tells you that you are at risk for this type of infection. Your sexual activity has changed since you were last screened, and you are at increased risk for chlamydia or gonorrhea. Ask your health care provider if you are at risk. Ask your health care provider about whether you are at high risk for HIV. Your health care provider may recommend a prescription medicine to help prevent HIV infection. If you choose to take medicine to prevent HIV, you should first get tested for HIV. You should then be tested every 3 months for as long as you are taking the medicine. Pregnancy If you are about to stop having your period (premenopausal) and you may become pregnant, seek counseling before you get pregnant. Take 400 to 800 micrograms (mcg) of folic acid every day if you become pregnant. Ask for birth control (contraception) if you want to prevent pregnancy. Osteoporosis and menopause Osteoporosis is a disease in which the bones lose minerals and strength with aging. This can result in bone fractures. If you are 47 years old or older, or if you are at risk for osteoporosis and fractures, ask your health care provider if you should: Be screened for bone loss. Take a calcium or vitamin D supplement to lower your risk of fractures. Be given hormone replacement therapy (HRT) to treat symptoms of menopause. Follow these instructions at home: Alcohol use Do not drink alcohol if: Your health care provider tells you not to drink. You are pregnant, may be pregnant, or are planning to become pregnant. If you drink alcohol: Limit how much you have to: 0-1 drink a day. Know how much alcohol is in your drink. In the [place] one drink equals one 12 oz bottle of beer (355 mL), one 5 oz glass  of wine (148 mL), or one 1 oz glass of hard liquor (44 mL). Lifestyle Do not use any products that contain nicotine or tobacco. These products include cigarettes, chewing tobacco, and vaping devices, such as e-cigarettes. If you need help quitting, ask your health care provider. Do not use street drugs. Do not share needles. Ask your health care provider for help if you need support or information about quitting drugs. General instructions Schedule regular health, dental, and eye exams. Stay current with your vaccines. Tell your health care provider if: You often feel depressed. You have ever been abused or do not feel safe at home. Summary Adopting a healthy lifestyle and getting preventive care are important in promoting health and wellness. Follow your health care provider's instructions about healthy diet, exercising, and getting tested or screened for diseases. Follow your health care provider's instructions on monitoring your cholesterol and blood pressure. This information is not intended to replace advice given to you by your health care  provider. Make sure you discuss any questions you have with your health care provider. Document Revised: 02/24/2021 Document Reviewed: 02/24/2021 Elsevier Patient Education  [place].

## 2021-12-30 ENCOUNTER — Ambulatory Visit (INDEPENDENT_AMBULATORY_CARE_PROVIDER_SITE_OTHER): Payer: Medicare HMO

## 2021-12-30 ENCOUNTER — Ambulatory Visit: Payer: Self-pay

## 2021-12-30 ENCOUNTER — Ambulatory Visit (INDEPENDENT_AMBULATORY_CARE_PROVIDER_SITE_OTHER): Payer: Medicare HMO | Admitting: Sports Medicine

## 2021-12-30 ENCOUNTER — Other Ambulatory Visit: Payer: Self-pay

## 2021-12-30 VITALS — BP 110/80 | HR 65 | Ht 63.0 in | Wt 175.0 lb

## 2021-12-30 DIAGNOSIS — M19011 Primary osteoarthritis, right shoulder: Secondary | ICD-10-CM | POA: Diagnosis not present

## 2021-12-30 DIAGNOSIS — M25511 Pain in right shoulder: Secondary | ICD-10-CM

## 2021-12-30 NOTE — Progress Notes (Signed)
? ? Evelyn Le ?[facility] ?[place] ?Phone: [phone] ?  ?Assessment and Plan:   ?  ?1. Acute pain of right shoulder ?2. Osteoarthritis of right AC (acromioclavicular) joint ?-Chronic with exacerbation, initial sports medicine visit ?- Acute left shoulder pain with palpable mass superficial to [facility] joint present over the past 4 days since doing yard work over the weekend.  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  Tolerated well per note below ?- X-ray obtained in clinic.  My interpretation: No acute fracture or dislocation.  Cortical changes with decreased space at [facility] joint and soft tissue edema.  Coracoid appears to have atypical rotation, which may be angle that x-ray was taken versus pathology.  No fracture of coracoid seen.  Will await for final radiology review ?- Recommend relative rest x1 week ?- Continue Tylenol 501,000 mg 2-3 times a day as needed for day-to-day pain relief ?- DG Shoulder Right; Future ?- [place] LIMITED JOINT SPACE STRUCTURES UP RIGHT(NO LINKED CHARGES)  ? ?Sports Medicine: Musculoskeletal Ultrasound. ?Procedure: Ultrasound Guided Acromioclavicular Joint Injection/Aspiration ?Side: Right ?Diagnosis: AC arthritis with superficial effusion ?[place] Indication:  ?- accuracy is paramount for diagnosis ?- to ensure therapeutic efficacy or procedural success ?- to reduce procedural risk ? ?After explaining the procedure, viable alternatives, risks, and answering any questions, consent was given verbally. The site was cleaned with chlorhexidine prep. An ultrasound transducer was placed on the shoulder.  The acromioclavicular joint was identified.  The capsule is not intact with a well-circumscribed circular hypoechoic mass.  A 25-gauge needle was advanced under ultrasound with attempted aspiration, but no fluid was aspirated.  A steroid injection was  performed under ultrasound guidance with sterile technique using  0.5 ml of 1% lidocaine without epinephrine and 20 mg of triamcinolone (KENALOG) '40mg'$ /ml. This was well tolerated and resulted in relief.  Needle was removed and dressing placed and post injection instructions were given including  a discussion of likely return of pain today after the anesthetic wears off (with the possibility of worsened pain) until the steroid starts to work in 1-3 days.   Pt was advised to call or return to clinic if these symptoms worsen or fail to improve as anticipated. ? ? ?Pertinent previous records reviewed include none ?  ?Follow Up: As needed if no improvement or worsening of symptoms.  Could consider repeat ultrasound ?  ?Subjective:   ?I, Pincus Badder, am serving as a Education administrator for [employer] ? ?Chief Complaint: bump on shoulder  ? ?HPI:  ? ?12/30/21 ?Patient is a 66 year old female complaining of bump on shoulder. Patient states bump started Sunday is TTP , no numbness or tingling just achy, no meds, has ROM is achy and real sore to the touch  ? ?Relevant Historical Information: Left knee replacement 12/11/2019, osteoporosis ? ?Additional pertinent review of systems negative. ? ? ?Current Outpatient Medications:  ?  escitalopram (LEXAPRO) 20 MG tablet, Take 1 tablet (20 mg total) by mouth daily., Disp: 90 tablet, Rfl: 3 ?  LORazepam (ATIVAN) 0.5 MG tablet, TAKE 1/2 (ONE-HALF) TABLET BY MOUTH ONCE AS NEEDED FOR ANXIETY, Disp: 30 tablet, Rfl: 0  ? ?Objective:   ?  ?Vitals:  ? 12/30/21 1051  ?BP: 110/80  ?Pulse: 65  ?SpO2: 96%  ?Weight: 175 lb (79.4 kg)  ?Height: '5\' 3"'$  (1.6 m)  ?  ?  ?Body mass  index is 31 kg/m?.  ?  ?Physical Exam:   ? ?Gen: Appears well, nad, nontoxic and pleasant ?Neuro:sensation intact, strength is 5/5 with df/pf/inv/ev, muscle tone wnl ?Skin: no suspicious lesion or defmority ?Psych: A&O, appropriate mood and affect ? ?Right shoulder: Soft tissue swelling over AC joint without lesion,  erythema, warmth ?No scapular winging ?FF 180, abd 180, int 0, ext 90 ?TTP AC joint, clavicle, acromion ?NTTP over the [place],  , biceps groove, humerus, deltoid, trapezius, cervical spine ?Neg neer, hawkings, empty can, subscap liftoff, speeds, obriens, crossarm ?Neg ant drawer, sulcus sign, apprehension ?Negative Spurling's test bilat ?FROM of neck  ? ? ?Electronically signed by:  ?Evelyn Le ?[facility] ?11:41 AM 12/30/21 ?

## 2021-12-30 NOTE — Patient Instructions (Addendum)
Good to see you ?Relative rest for 1 week ?Voltaren gel as needed  ?Tylenol [phone] mg 2-3 times a day for pain relief  ?As needed follow up  ? ? ? ? ?

## 2022-01-08 ENCOUNTER — Ambulatory Visit (INDEPENDENT_AMBULATORY_CARE_PROVIDER_SITE_OTHER): Payer: Medicare HMO | Admitting: Orthopaedic Surgery

## 2022-01-08 VITALS — Ht 63.0 in | Wt 175.0 lb

## 2022-01-08 DIAGNOSIS — M25561 Pain in right knee: Secondary | ICD-10-CM

## 2022-01-08 DIAGNOSIS — M170 Bilateral primary osteoarthritis of knee: Secondary | ICD-10-CM | POA: Insufficient documentation

## 2022-01-08 DIAGNOSIS — M1711 Unilateral primary osteoarthritis, right knee: Secondary | ICD-10-CM | POA: Diagnosis not present

## 2022-01-08 DIAGNOSIS — G8929 Other chronic pain: Secondary | ICD-10-CM

## 2022-01-08 NOTE — Progress Notes (Signed)
? ?Office Visit Note ?  ?Patient: Evelyn Le           ?Date of Birth: 1956/04/18           ?MRN: [id number] ?Visit Date: 01/08/2022 ?             ?Requested by: Lindell Spar, MD ?[address] ?[place],  [place] [address] ?PCP: Lindell Spar, MD ? ? ?Assessment & Plan: ?Visit Diagnoses:  ?1. Chronic pain of right knee   ?2. Unilateral primary osteoarthritis, right knee   ? ? ?Plan: I agree that the patient has severe end-stage arthritis of her right knee and is in need of knee replacement surgery given the failure of conservative treatment and given her clinical evaluation as well as signs and symptoms.  Having had the surgery before she is fully aware of what to expect with an intraoperative and postoperative course.  She is aware of the risk and benefits of this type of surgery as well.  All questions and concerns were answered and addressed.  We will work on getting her scheduled for a right knee replacement. ? ?Follow-Up Instructions: Return for 2 weeks post-op.  ? ?Orders:  ?No orders of the defined types were placed in this encounter. ? ?No orders of the defined types were placed in this encounter. ? ? ? ? Procedures: ?No procedures performed ? ? ?Clinical Data: ?No additional findings. ? ? ?Subjective: ?Chief Complaint  ?Patient presents with  ? Right Knee - Pain  ?The patient is a very pleasant 66 year old female that I am seeing for the first time.  She has known well-documented severe arthritis of her right knee.  She actually had a very successful left knee replacement done about 3 years ago in [place].  The knee has done excellent for her.  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  At this point her right knee pain is detrimentally affecting her mobility, her quality of life and actives daily living.  She walks with a significant limp.  She is not a diabetic and not on blood thinning medications.  She has had numerous injections in her right knee and that has not helped at all at this point.  She  reports weakness and popping as well as locking and giving way.  The right knee pain does wake her up at night.  This is been worsening over the last 12 months.  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 ? ?HPI ? ?Review of Systems ?There is currently listed no headache, chest pain, shortness of breath, fever, chills, nausea, vomiting ? ?Objective: ?Vital Signs: Ht '5\' 3"'$  (1.6 m)   Wt 175 lb (79.4 kg)   BMI 31.00 kg/m?  ? ?Physical Exam ?She is alert and orient x3 and in no acute distress ?Ortho Exam ?Examination of her right knee shows valgus malalignment.  There is a moderate joint effusion.  There is pain on the medial and lateral joint lines and patellofemoral crepitation.  There is pain throughout the arc of motion of her right knee.  Her left knee looks great with a well-healed surgical incision no effusion.  It is nice and straight. ?Specialty Comments:  ?No specialty comments available. ? ?Imaging: ?No results found. ?2 views of the right knee show valgus malalignment.  There is significant loss of the joint space at the patellofemoral joint and especially laterally.  There are osteophytes in all 3 compartments.  This  is severe tricompartmental arthritis. ? ?PMFS History: ?Patient Active Problem List  ? Diagnosis Date Noted  ? Unilateral primary osteoarthritis, right knee 01/08/2022  ? Mixed hyperlipidemia 10/09/2021  ? Vitamin D deficiency 10/09/2021  ? Prediabetes 10/09/2021  ? Age-related osteoporosis without current pathological fracture 03/21/2021  ? Anxiety 11/08/2020  ? History of ovarian cancer 11/08/2020  ? History of total knee arthroplasty, left 11/08/2020  ? Postoperative incisional hernia 11/08/2020  ? ?Past Medical History:  ?Diagnosis Date  ? Cancer [facility])   ?  ?No family history on file.  ?Past Surgical History:  ?Procedure Laterality Date  ? ABDOMINAL HYSTERECTOMY    ? REPLACEMENT TOTAL KNEE    ? ?Social  History  ? ?Occupational History  ? Not on file  ?Tobacco Use  ? Smoking status: Never  ? Smokeless tobacco: Never  ?Substance and Sexual Activity  ? Alcohol use: Not on file  ? Drug use: Not on file  ? Sexual activity: Not on file  ? ? ? ? ? ? ?

## 2022-01-27 ENCOUNTER — Other Ambulatory Visit: Payer: Self-pay

## 2022-02-13 ENCOUNTER — Other Ambulatory Visit: Payer: Self-pay | Admitting: Physician Assistant

## 2022-02-13 DIAGNOSIS — M1711 Unilateral primary osteoarthritis, right knee: Secondary | ICD-10-CM

## 2022-02-19 NOTE — Pre-Procedure Instructions (Signed)
Surgical Instructions ? ? ? Your procedure is scheduled on Monday, Mar 03, 2022 at 1:30 PM. ? Report to [facility] Main Entrance "A" at 11:30 A.M., then check in with the Admitting office. ? Call this number if you have problems the morning of surgery: ? [phone] ? ? If you have any questions prior to your surgery date call [phone]: Open Monday-Friday 8am-4pm ? ? ? Remember: ? Do not eat after midnight the night before your surgery ? ?You may drink clear liquids until 10:30 AM the morning of your surgery.   ?Clear liquids allowed are: Water, Non-Citrus Juices (without pulp), Carbonated Beverages, Clear Tea, Black Coffee Only (NO MILK, CREAM OR POWDERED CREAMER of any kind), and Gatorade. ? ? ?Enhanced Recovery after Surgery for Orthopedics ?Enhanced Recovery after Surgery is a protocol used to improve the stress on your body and your recovery after surgery. ? ?Patient Instructions ? ?The day of surgery (if you do NOT have diabetes):  ?Drink ONE (1) Pre-Surgery Clear Ensure by 10:30 am the morning of surgery   ?This drink was given to you during your hospital  ?pre-op appointment visit. ?Nothing else to drink after completing the  ?Pre-Surgery Clear Ensure. ? ?       If you have questions, please contact your surgeon?s office. ? ?  ? Take these medicines the morning of surgery with A SIP OF WATER: ? ?IF NEEDED: ?acetaminophen (TYLENOL) ?LORazepam (ATIVAN) ? ?As of today, STOP taking any Aspirin (unless otherwise instructed by your surgeon) Aleve, Naproxen, Ibuprofen, Motrin, Advil, Goody's, BC's, all herbal medications, fish oil, and all vitamins. ?         ?           ?Do NOT Smoke (Tobacco/Vaping) for 24 hours prior to your procedure. ? ?If you use a CPAP at night, you may bring your mask/headgear for your overnight stay. ?  ?Contacts, glasses, piercing's, hearing aid's, dentures or partials may not be worn into surgery, please bring cases for these belongings.  ?  ?For patients admitted to the hospital,  discharge time will be determined by your treatment team. ?  ?Patients discharged the day of surgery will not be allowed to drive home, and someone needs to stay with them for 24 hours. ? ?SURGICAL WAITING ROOM VISITATION ?Patients having surgery or a procedure may have two support people in the waiting area. ?Visitors may stay in the waiting area during the procedure and switch out with other visitors if needed. ?Children under the age of 70 must have an adult accompany them who is not the patient. ?If the patient needs to stay at the hospital during part of their recovery, the visitor guidelines for inpatient rooms apply. ? ?Please refer to the [facility] website for the visitor guidelines for Inpatients (after your surgery is over and you are in a regular room).  ? ? ?Special instructions:   ?[facility]- Preparing For Surgery ? ?Before surgery, you can play an important role. Because skin is not sterile, your skin needs to be as free of germs as possible. You can reduce the number of germs on your skin by washing with CHG (chlorahexidine gluconate) Soap before surgery.  CHG is an antiseptic cleaner which kills germs and bonds with the skin to continue killing germs even after washing.   ? ?Oral Hygiene is also important to reduce your risk of infection.  Remember - BRUSH YOUR TEETH THE MORNING OF SURGERY WITH YOUR REGULAR TOOTHPASTE ? ?Please do not use  if you have an allergy to CHG or antibacterial soaps. If your skin becomes reddened/irritated stop using the CHG.  ?Do not shave (including legs and underarms) for at least 48 hours prior to first CHG shower. It is OK to shave your face. ? ?Please follow these instructions carefully. ?  ?Shower the NIGHT BEFORE SURGERY and the MORNING OF SURGERY ? ?If you chose to wash your hair, wash your hair first as usual with your normal shampoo. ? ?After you shampoo, rinse your hair and body thoroughly to remove the shampoo. ? ?Use CHG Soap as you would any other liquid  soap. You can apply CHG directly to the skin and wash gently with a scrungie or a clean washcloth.  ? ?Apply the CHG Soap to your body ONLY FROM THE NECK DOWN.  Do not use on open wounds or open sores. Avoid contact with your eyes, ears, mouth and genitals (private parts). Wash Face and genitals (private parts)  with your normal soap.  ? ?Wash thoroughly, paying special attention to the area where your surgery will be performed. ? ?Thoroughly rinse your body with warm water from the neck down. ? ?DO NOT shower/wash with your normal soap after using and rinsing off the CHG Soap. ? ?Pat yourself dry with a CLEAN TOWEL. ? ?Wear CLEAN PAJAMAS to bed the night before surgery ? ?Place CLEAN SHEETS on your bed the night before your surgery ? ?DO NOT SLEEP WITH PETS. ? ? ?Day of Surgery: ?Take a shower with CHG soap. ?Do not wear jewelry or makeup ?Do not wear lotions, powders, perfumes, or deodorant. ?Do not shave 48 hours prior to surgery. ?Do not bring valuables to the hospital.  ?[facility] is not responsible for any belongings or valuables. ?Do not wear nail polish, gel polish, artificial nails, or any other type of covering on natural nails (fingers and toes) ?If you have artificial nails or gel coating that need to be removed by a nail salon, please have this removed prior to surgery. Artificial nails or gel coating may interfere with anesthesia's ability to adequately monitor your vital signs. ?Wear Clean/Comfortable clothing the morning of surgery ?Do not apply any deodorants/lotions.   ?Remember to brush your teeth WITH YOUR REGULAR TOOTHPASTE. ?  ?Please read over the following fact sheets that you were given. ? ?If you received a COVID test during your pre-op visit  it is requested that you wear a mask when out in public, stay away from anyone that may not be feeling well and notify your surgeon if you develop symptoms. If you have been in contact with anyone that has tested positive in the last 10 days please  notify you surgeon. ?  ? ?

## 2022-02-20 ENCOUNTER — Other Ambulatory Visit: Payer: Self-pay

## 2022-02-20 ENCOUNTER — Encounter (HOSPITAL_COMMUNITY)
Admission: RE | Admit: 2022-02-20 | Discharge: 2022-02-20 | Disposition: A | Discharge status: Home / Self Care | Payer: Medicare HMO | Source: Ambulatory Visit | Attending: Orthopaedic Surgery | Admitting: Orthopaedic Surgery

## 2022-02-20 ENCOUNTER — Encounter (HOSPITAL_COMMUNITY): Payer: Self-pay

## 2022-02-20 VITALS — BP 108/78 | HR 57 | Temp 98.2°F | Resp 17 | Ht 63.0 in | Wt 175.4 lb

## 2022-02-20 DIAGNOSIS — Z01818 Encounter for other preprocedural examination: Secondary | ICD-10-CM

## 2022-02-20 DIAGNOSIS — Z01812 Encounter for preprocedural laboratory examination: Secondary | ICD-10-CM | POA: Diagnosis present

## 2022-02-20 DIAGNOSIS — M1711 Unilateral primary osteoarthritis, right knee: Secondary | ICD-10-CM | POA: Diagnosis not present

## 2022-02-20 HISTORY — DX: Depression, unspecified: F32.A

## 2022-02-20 HISTORY — DX: Anxiety disorder, unspecified: F41.9

## 2022-02-20 HISTORY — DX: Other specified postprocedural states: R11.2

## 2022-02-20 HISTORY — DX: Other specified postprocedural states: Z98.890

## 2022-02-20 LAB — CBC
HCT: 44 % (ref 36.0–46.0)
Hemoglobin: 14.3 g/dL (ref 12.0–15.0)
MCH: 27.9 pg (ref 26.0–34.0)
MCHC: 32.5 g/dL (ref 30.0–36.0)
MCV: 85.9 fL (ref 80.0–100.0)
Platelets: 282 10*3/uL (ref 150–400)
RBC: 5.12 MIL/uL — ABNORMAL HIGH (ref 3.87–5.11)
RDW: 12.7 % (ref 11.5–15.5)
WBC: 5.9 10*3/uL (ref 4.0–10.5)
nRBC: 0 % (ref 0.0–0.2)

## 2022-02-20 LAB — SURGICAL PCR SCREEN
MRSA, PCR: NEGATIVE
Staphylococcus aureus: POSITIVE — AB

## 2022-02-20 LAB — TYPE AND SCREEN
ABO/RH(D): O NEG
Antibody Screen: NEGATIVE

## 2022-02-20 NOTE — Progress Notes (Signed)
PCP - Lindell Spar MD ?Cardiologist - denies ? ?PPM/ICD - denies ?Device Orders -  ?Rep Notified -  ? ?Chest x-ray - none ?EKG - none ?Stress Test - pt states she had one in 2009 as part of her cancer treatment.   ?ECHO - none ?Cardiac Cath - denies ? ?Sleep Study - none ?CPAP -  ? ?Fasting Blood Sugar - na ?Checks Blood Sugar _____ times a day ? ?Blood Thinner Instructions:na ?Aspirin Instructions:na ? ?ERAS Protcol -clear liquids until 1030 ?PRE-SURGERY Ensure or G2-  ? ?COVID TEST- no ? ? ?Anesthesia review: no ? ?Patient denies shortness of breath, fever, cough and chest pain at PAT appointment ? ? ?All instructions explained to the patient, with a verbal understanding of the material. Patient agrees to go over the instructions while at home for a better understanding. Patient also instructed to wear a mask when out in public prior to surgery. The opportunity to ask questions was provided. ?  ?

## 2022-02-20 NOTE — Pre-Procedure Instructions (Signed)
Surgical Instructions ? ? ? Your procedure is scheduled on Mar 03, 2022 at 1:30 PM. ? Report to [facility] Main Entrance "A" at 11:30 A.M., then check in with the Admitting office. ? Call this number if you have problems the morning of surgery: ? [phone] ? ? If you have any questions prior to your surgery date call [phone]: Open Monday-Friday 8am-4pm ? ? ? Remember: ? Do not eat after midnight the night before your surgery ? ?You may drink clear liquids until 10:30 AM the morning of your surgery.   ?Clear liquids allowed are: Water, Non-Citrus Juices (without pulp), Carbonated Beverages, Clear Tea, Black Coffee Only (NO MILK, CREAM OR POWDERED CREAMER of any kind), and Gatorade. ? ? ?Enhanced Recovery after Surgery for Orthopedics ?Enhanced Recovery after Surgery is a protocol used to improve the stress on your body and your recovery after surgery. ? ?Patient Instructions ? ?The day of surgery (if you do NOT have diabetes):  ?Drink ONE (1) Pre-Surgery Clear Ensure by 10:30 am the morning of surgery   ?This drink was given to you during your hospital  ?pre-op appointment visit. ?Nothing else to drink after completing the  ?Pre-Surgery Clear Ensure. ? ?       If you have questions, please contact your surgeon?s office. ? ?  ? Take these medicines the morning of surgery with A SIP OF WATER: ? ?IF NEEDED: ?acetaminophen (TYLENOL) ?LORazepam (ATIVAN) ? ?As of today, STOP taking any Aspirin (unless otherwise instructed by your surgeon) Aleve, Naproxen, Ibuprofen, Motrin, Advil, Goody's, BC's, all herbal medications, fish oil, and all vitamins. ?         ?           ?Do NOT Smoke (Tobacco/Vaping) for 24 hours prior to your procedure. ? ?If you use a CPAP at night, you may bring your mask/headgear for your overnight stay. ?  ?Contacts, glasses, piercing's, hearing aid's, dentures or partials may not be worn into surgery, please bring cases for these belongings.  ?  ?For patients admitted to the hospital, discharge  time will be determined by your treatment team. ?  ?Patients discharged the day of surgery will not be allowed to drive home, and someone needs to stay with them for 24 hours. ? ?SURGICAL WAITING ROOM VISITATION ?Patients having surgery or a procedure may have two support people in the waiting area. ?Visitors may stay in the waiting area during the procedure and switch out with other visitors if needed. ?Children under the age of 54 must have an adult accompany them who is not the patient. ?If the patient needs to stay at the hospital during part of their recovery, the visitor guidelines for inpatient rooms apply. ? ?Please refer to the [facility] website for the visitor guidelines for Inpatients (after your surgery is over and you are in a regular room).  ? ? ?Special instructions:   ?[facility]- Preparing For Surgery ? ?Before surgery, you can play an important role. Because skin is not sterile, your skin needs to be as free of germs as possible. You can reduce the number of germs on your skin by washing with CHG (chlorahexidine gluconate) Soap before surgery.  CHG is an antiseptic cleaner which kills germs and bonds with the skin to continue killing germs even after washing.   ? ?Oral Hygiene is also important to reduce your risk of infection.  Remember - BRUSH YOUR TEETH THE MORNING OF SURGERY WITH YOUR REGULAR TOOTHPASTE ? ?Please do not use if  you have an allergy to CHG or antibacterial soaps. If your skin becomes reddened/irritated stop using the CHG.  ?Do not shave (including legs and underarms) for at least 48 hours prior to first CHG shower. It is OK to shave your face. ? ?Please follow these instructions carefully. ?  ?Shower the NIGHT BEFORE SURGERY and the MORNING OF SURGERY ? ?If you chose to wash your hair, wash your hair first as usual with your normal shampoo. ? ?After you shampoo, rinse your hair and body thoroughly to remove the shampoo. ? ?Use CHG Soap as you would any other liquid soap. You can  apply CHG directly to the skin and wash gently with a scrungie or a clean washcloth.  ? ?Apply the CHG Soap to your body ONLY FROM THE NECK DOWN.  Do not use on open wounds or open sores. Avoid contact with your eyes, ears, mouth and genitals (private parts). Wash Face and genitals (private parts)  with your normal soap.  ? ?Wash thoroughly, paying special attention to the area where your surgery will be performed. ? ?Thoroughly rinse your body with warm water from the neck down. ? ?DO NOT shower/wash with your normal soap after using and rinsing off the CHG Soap. ? ?Pat yourself dry with a CLEAN TOWEL. ? ?Wear CLEAN PAJAMAS to bed the night before surgery ? ?Place CLEAN SHEETS on your bed the night before your surgery ? ?DO NOT SLEEP WITH PETS. ? ? ?Day of Surgery: ?Take a shower with CHG soap. ?Do not wear jewelry or makeup ?Do not wear lotions, powders, perfumes, or deodorant. ?Do not shave 48 hours prior to surgery. ?Do not bring valuables to the hospital.  ?[facility] is not responsible for any belongings or valuables. ?Do not wear nail polish, gel polish, artificial nails, or any other type of covering on natural nails (fingers and toes) ?If you have artificial nails or gel coating that need to be removed by a nail salon, please have this removed prior to surgery. Artificial nails or gel coating may interfere with anesthesia's ability to adequately monitor your vital signs. ?Wear Clean/Comfortable clothing the morning of surgery ?Do not apply any deodorants/lotions.   ?Remember to brush your teeth WITH YOUR REGULAR TOOTHPASTE. ?  ?Please read over the following fact sheets that you were given. ? ?If you received a COVID test during your pre-op visit  it is requested that you wear a mask when out in public, stay away from anyone that may not be feeling well and notify your surgeon if you develop symptoms. If you have been in contact with anyone that has tested positive in the last 10 days please notify you  surgeon. ?  ? ?

## 2022-03-03 ENCOUNTER — Observation Stay (HOSPITAL_COMMUNITY)
Admission: RE | Admit: 2022-03-03 | Discharge: 2022-03-04 | Disposition: A | Discharge status: Home Health | Payer: Medicare HMO | Attending: Orthopaedic Surgery | Admitting: Orthopaedic Surgery

## 2022-03-03 ENCOUNTER — Encounter (HOSPITAL_COMMUNITY): Payer: Self-pay | Admitting: Orthopaedic Surgery

## 2022-03-03 ENCOUNTER — Observation Stay (HOSPITAL_COMMUNITY): Payer: Medicare HMO

## 2022-03-03 ENCOUNTER — Other Ambulatory Visit: Payer: Self-pay

## 2022-03-03 ENCOUNTER — Ambulatory Visit (HOSPITAL_COMMUNITY): Payer: Medicare HMO | Admitting: Vascular Surgery

## 2022-03-03 ENCOUNTER — Encounter (HOSPITAL_COMMUNITY)
Admission: RE | Disposition: A | Discharge status: Home Health | Payer: Self-pay | Source: Home / Self Care | Attending: Orthopaedic Surgery

## 2022-03-03 ENCOUNTER — Ambulatory Visit (HOSPITAL_BASED_OUTPATIENT_CLINIC_OR_DEPARTMENT_OTHER): Payer: Medicare HMO | Admitting: Anesthesiology

## 2022-03-03 DIAGNOSIS — M21061 Valgus deformity, not elsewhere classified, right knee: Secondary | ICD-10-CM | POA: Diagnosis not present

## 2022-03-03 DIAGNOSIS — Z96651 Presence of right artificial knee joint: Secondary | ICD-10-CM | POA: Diagnosis not present

## 2022-03-03 DIAGNOSIS — M170 Bilateral primary osteoarthritis of knee: Secondary | ICD-10-CM | POA: Diagnosis present

## 2022-03-03 DIAGNOSIS — R7303 Prediabetes: Secondary | ICD-10-CM | POA: Diagnosis not present

## 2022-03-03 DIAGNOSIS — M1711 Unilateral primary osteoarthritis, right knee: Secondary | ICD-10-CM

## 2022-03-03 DIAGNOSIS — Z79899 Other long term (current) drug therapy: Secondary | ICD-10-CM | POA: Insufficient documentation

## 2022-03-03 DIAGNOSIS — Z96652 Presence of left artificial knee joint: Secondary | ICD-10-CM | POA: Insufficient documentation

## 2022-03-03 DIAGNOSIS — G8918 Other acute postprocedural pain: Secondary | ICD-10-CM | POA: Diagnosis not present

## 2022-03-03 DIAGNOSIS — Z8543 Personal history of malignant neoplasm of ovary: Secondary | ICD-10-CM | POA: Insufficient documentation

## 2022-03-03 HISTORY — PX: TOTAL KNEE ARTHROPLASTY: SHX125

## 2022-03-03 LAB — ABO/RH: ABO/RH(D): O NEG

## 2022-03-03 SURGERY — ARTHROPLASTY, KNEE, TOTAL
Anesthesia: Monitor Anesthesia Care | Site: Knee | Laterality: Right

## 2022-03-03 MED ORDER — ONDANSETRON HCL 4 MG/2ML IJ SOLN
INTRAMUSCULAR | Status: DC | PRN
  Administered 2022-03-03: 4 mg via INTRAVENOUS

## 2022-03-03 MED ORDER — ACETAMINOPHEN 325 MG PO TABS: 325.0000 mg | ORAL_TABLET | Freq: Four times a day (QID) | ORAL | Status: DC | PRN

## 2022-03-03 MED ORDER — ACETAMINOPHEN 160 MG/5ML PO SOLN: 325.0000 mg | ORAL | Status: DC | PRN

## 2022-03-03 MED ORDER — METOCLOPRAMIDE HCL 5 MG/ML IJ SOLN: 5.0000 mg | Freq: Three times a day (TID) | INTRAMUSCULAR | Status: DC | PRN

## 2022-03-03 MED ORDER — SODIUM CHLORIDE 0.9 % IV SOLN: INTRAVENOUS | Status: DC

## 2022-03-03 MED ORDER — VANCOMYCIN HCL IN DEXTROSE 1-5 GM/200ML-% IV SOLN
INTRAVENOUS | Status: AC
  Administered 2022-03-03: 1000 mg via INTRAVENOUS
  Filled 2022-03-03: qty 200

## 2022-03-03 MED ORDER — FENTANYL CITRATE (PF) 250 MCG/5ML IJ SOLN
INTRAMUSCULAR | Status: AC
  Filled 2022-03-03: qty 5

## 2022-03-03 MED ORDER — HYDROMORPHONE HCL 1 MG/ML IJ SOLN
0.5000 mg | INTRAMUSCULAR | Status: DC | PRN
  Administered 2022-03-03 – 2022-03-04 (×2): 1 mg via INTRAVENOUS
  Filled 2022-03-03 (×2): qty 1

## 2022-03-03 MED ORDER — FENTANYL CITRATE (PF) 100 MCG/2ML IJ SOLN: 25.0000 ug | INTRAMUSCULAR | Status: DC | PRN

## 2022-03-03 MED ORDER — VANCOMYCIN HCL IN DEXTROSE 1-5 GM/200ML-% IV SOLN
1000.0000 mg | Freq: Two times a day (BID) | INTRAVENOUS | Status: AC
  Administered 2022-03-04: 1000 mg via INTRAVENOUS
  Filled 2022-03-03 (×2): qty 200

## 2022-03-03 MED ORDER — POLYETHYLENE GLYCOL 3350 17 G PO PACK: 17.0000 g | PACK | Freq: Every day | ORAL | Status: DC | PRN

## 2022-03-03 MED ORDER — OXYCODONE HCL 5 MG PO TABS
5.0000 mg | ORAL_TABLET | ORAL | Status: DC | PRN
  Administered 2022-03-03 – 2022-03-04 (×2): 10 mg via ORAL
  Filled 2022-03-03 (×2): qty 2

## 2022-03-03 MED ORDER — MIDAZOLAM HCL 2 MG/2ML IJ SOLN
INTRAMUSCULAR | Status: AC
  Administered 2022-03-03: 2 mg via INTRAVENOUS
  Filled 2022-03-03: qty 2

## 2022-03-03 MED ORDER — SODIUM CHLORIDE 0.9 % IR SOLN
Status: DC | PRN
  Administered 2022-03-03: 1000 mL

## 2022-03-03 MED ORDER — OXYCODONE HCL 5 MG PO TABS: 5.0000 mg | ORAL_TABLET | Freq: Once | ORAL | Status: DC | PRN

## 2022-03-03 MED ORDER — ASPIRIN 81 MG PO CHEW
81.0000 mg | CHEWABLE_TABLET | Freq: Two times a day (BID) | ORAL | Status: DC
  Administered 2022-03-03 – 2022-03-04 (×2): 81 mg via ORAL
  Filled 2022-03-03 (×2): qty 1

## 2022-03-03 MED ORDER — METHOCARBAMOL 500 MG PO TABS
500.0000 mg | ORAL_TABLET | Freq: Four times a day (QID) | ORAL | Status: DC | PRN
  Administered 2022-03-03: 500 mg via ORAL
  Filled 2022-03-03: qty 1

## 2022-03-03 MED ORDER — OXYCODONE HCL 5 MG/5ML PO SOLN: 5.0000 mg | Freq: Once | ORAL | Status: DC | PRN

## 2022-03-03 MED ORDER — 0.9 % SODIUM CHLORIDE (POUR BTL) OPTIME
TOPICAL | Status: DC | PRN
  Administered 2022-03-03: 1000 mL

## 2022-03-03 MED ORDER — DOCUSATE SODIUM 100 MG PO CAPS
100.0000 mg | ORAL_CAPSULE | Freq: Two times a day (BID) | ORAL | Status: DC
  Administered 2022-03-03 – 2022-03-04 (×2): 100 mg via ORAL
  Filled 2022-03-03 (×3): qty 1

## 2022-03-03 MED ORDER — ORAL CARE MOUTH RINSE: 15.0000 mL | Freq: Once | OROMUCOSAL | Status: AC

## 2022-03-03 MED ORDER — MENTHOL 3 MG MT LOZG: 1.0000 | LOZENGE | OROMUCOSAL | Status: DC | PRN

## 2022-03-03 MED ORDER — ONDANSETRON HCL 4 MG PO TABS: 4.0000 mg | ORAL_TABLET | Freq: Four times a day (QID) | ORAL | Status: DC | PRN

## 2022-03-03 MED ORDER — PHENYLEPHRINE HCL-NACL 20-0.9 MG/250ML-% IV SOLN
INTRAVENOUS | Status: DC | PRN
  Administered 2022-03-03: 20 ug/min via INTRAVENOUS

## 2022-03-03 MED ORDER — ROPIVACAINE HCL 7.5 MG/ML IJ SOLN
INTRAMUSCULAR | Status: DC | PRN
  Administered 2022-03-03: 25 mL via PERINEURAL

## 2022-03-03 MED ORDER — CHLORHEXIDINE GLUCONATE 0.12 % MT SOLN: 15.0000 mL | Freq: Once | OROMUCOSAL | Status: AC

## 2022-03-03 MED ORDER — CHLORHEXIDINE GLUCONATE 0.12 % MT SOLN
OROMUCOSAL | Status: AC
  Administered 2022-03-03: 15 mL via OROMUCOSAL
  Filled 2022-03-03: qty 15

## 2022-03-03 MED ORDER — DIPHENHYDRAMINE HCL 12.5 MG/5ML PO ELIX: 12.5000 mg | ORAL_SOLUTION | ORAL | Status: DC | PRN

## 2022-03-03 MED ORDER — MEPERIDINE HCL 25 MG/ML IJ SOLN: 6.2500 mg | INTRAMUSCULAR | Status: DC | PRN

## 2022-03-03 MED ORDER — VANCOMYCIN HCL IN DEXTROSE 1-5 GM/200ML-% IV SOLN: 1000.0000 mg | INTRAVENOUS | Status: AC

## 2022-03-03 MED ORDER — FENTANYL CITRATE (PF) 250 MCG/5ML IJ SOLN
INTRAMUSCULAR | Status: DC | PRN
  Administered 2022-03-03: 50 ug via INTRAVENOUS

## 2022-03-03 MED ORDER — DEXAMETHASONE SODIUM PHOSPHATE 10 MG/ML IJ SOLN
INTRAMUSCULAR | Status: AC
  Filled 2022-03-03: qty 1

## 2022-03-03 MED ORDER — ONDANSETRON HCL 4 MG/2ML IJ SOLN: 4.0000 mg | Freq: Once | INTRAMUSCULAR | Status: DC | PRN

## 2022-03-03 MED ORDER — ACETAMINOPHEN 325 MG PO TABS: 325.0000 mg | ORAL_TABLET | ORAL | Status: DC | PRN

## 2022-03-03 MED ORDER — PROPOFOL 10 MG/ML IV BOLUS
INTRAVENOUS | Status: DC | PRN
  Administered 2022-03-03: 100 ug/kg/min via INTRAVENOUS
  Administered 2022-03-03: 150 ug/kg/min via INTRAVENOUS

## 2022-03-03 MED ORDER — TRANEXAMIC ACID-NACL 1000-0.7 MG/100ML-% IV SOLN
1000.0000 mg | INTRAVENOUS | Status: AC
  Administered 2022-03-03: 1000 mg via INTRAVENOUS

## 2022-03-03 MED ORDER — PANTOPRAZOLE SODIUM 40 MG PO TBEC
40.0000 mg | DELAYED_RELEASE_TABLET | Freq: Every day | ORAL | Status: DC
  Administered 2022-03-03 – 2022-03-04 (×2): 40 mg via ORAL
  Filled 2022-03-03 (×2): qty 1

## 2022-03-03 MED ORDER — ALUM & MAG HYDROXIDE-SIMETH 200-200-20 MG/5ML PO SUSP: 30.0000 mL | ORAL | Status: DC | PRN

## 2022-03-03 MED ORDER — PHENOL 1.4 % MT LIQD: 1.0000 | OROMUCOSAL | Status: DC | PRN

## 2022-03-03 MED ORDER — ESCITALOPRAM OXALATE 10 MG PO TABS
20.0000 mg | ORAL_TABLET | Freq: Every day | ORAL | Status: DC
  Administered 2022-03-03: 20 mg via ORAL
  Filled 2022-03-03: qty 2

## 2022-03-03 MED ORDER — ONDANSETRON HCL 4 MG/2ML IJ SOLN
INTRAMUSCULAR | Status: AC
  Filled 2022-03-03: qty 2

## 2022-03-03 MED ORDER — METOCLOPRAMIDE HCL 5 MG PO TABS: 5.0000 mg | ORAL_TABLET | Freq: Three times a day (TID) | ORAL | Status: DC | PRN

## 2022-03-03 MED ORDER — METHOCARBAMOL 1000 MG/10ML IJ SOLN: 500.0000 mg | Freq: Four times a day (QID) | INTRAVENOUS | Status: DC | PRN

## 2022-03-03 MED ORDER — FENTANYL CITRATE (PF) 100 MCG/2ML IJ SOLN: 100.0000 ug | Freq: Once | INTRAMUSCULAR | Status: AC

## 2022-03-03 MED ORDER — TRANEXAMIC ACID-NACL 1000-0.7 MG/100ML-% IV SOLN
INTRAVENOUS | Status: AC
  Filled 2022-03-03: qty 100

## 2022-03-03 MED ORDER — POVIDONE-IODINE 10 % EX SWAB
2.0000 "application " | Freq: Once | CUTANEOUS | Status: AC
  Administered 2022-03-03: 2 via TOPICAL

## 2022-03-03 MED ORDER — FENTANYL CITRATE (PF) 100 MCG/2ML IJ SOLN
INTRAMUSCULAR | Status: AC
  Administered 2022-03-03: 100 ug via INTRAVENOUS
  Filled 2022-03-03: qty 2

## 2022-03-03 MED ORDER — LORAZEPAM 0.5 MG PO TABS: 0.5000 mg | ORAL_TABLET | Freq: Every day | ORAL | Status: DC | PRN

## 2022-03-03 MED ORDER — HYDROMORPHONE HCL 2 MG PO TABS: 2.0000 mg | ORAL_TABLET | ORAL | Status: DC | PRN

## 2022-03-03 MED ORDER — DEXAMETHASONE SODIUM PHOSPHATE 10 MG/ML IJ SOLN
INTRAMUSCULAR | Status: DC | PRN
  Administered 2022-03-03 (×2): 10 mg

## 2022-03-03 MED ORDER — ONDANSETRON HCL 4 MG/2ML IJ SOLN: 4.0000 mg | Freq: Four times a day (QID) | INTRAMUSCULAR | Status: DC | PRN

## 2022-03-03 MED ORDER — LACTATED RINGERS IV SOLN
INTRAVENOUS | Status: DC
Start: 2022-03-03 — End: 2022-03-03

## 2022-03-03 MED ORDER — MIDAZOLAM HCL 2 MG/2ML IJ SOLN: 2.0000 mg | Freq: Once | INTRAMUSCULAR | Status: AC

## 2022-03-03 MED ORDER — EPHEDRINE SULFATE-NACL 50-0.9 MG/10ML-% IV SOSY
PREFILLED_SYRINGE | INTRAVENOUS | Status: DC | PRN
  Administered 2022-03-03: 5 mg via INTRAVENOUS

## 2022-03-03 SURGICAL SUPPLY — 74 items
BAG COUNTER SPONGE SURGICOUNT (BAG) ×2 IMPLANT
BAG SPNG CNTER NS LX DISP (BAG) ×1
BANDAGE ESMARK 6X9 LF (GAUZE/BANDAGES/DRESSINGS) ×1 IMPLANT
BLADE SAG 18X100X1.27 (BLADE) ×2 IMPLANT
BNDG CMPR 9X6 STRL LF SNTH (GAUZE/BANDAGES/DRESSINGS) ×1
BNDG ELASTIC 6X5.8 VLCR STR LF (GAUZE/BANDAGES/DRESSINGS) ×3 IMPLANT
BNDG ESMARK 6X9 LF (GAUZE/BANDAGES/DRESSINGS) ×2
BOWL SMART MIX CTS (DISPOSABLE) ×2 IMPLANT
BSPLAT TIB 5D D CMNT STM RT (Knees) ×1 IMPLANT
CEMENT BONE R 1X40 (Cement) ×2 IMPLANT
COMP FEM CR CEMT STD SZ5 (Joint) ×2 IMPLANT
COMPONENT FEM CR CEMT STD SZ5 (Joint) IMPLANT
COVER SURGICAL LIGHT HANDLE (MISCELLANEOUS) ×2 IMPLANT
CUFF TOURN SGL QUICK 34 (TOURNIQUET CUFF) ×2
CUFF TOURN SGL QUICK 42 (TOURNIQUET CUFF) IMPLANT
CUFF TRNQT CYL 34X4.125X (TOURNIQUET CUFF) ×1 IMPLANT
DRAPE EXTREMITY T 121X128X90 (DISPOSABLE) ×2 IMPLANT
DRAPE HALF SHEET 40X57 (DRAPES) ×2 IMPLANT
DRAPE U-SHAPE 47X51 STRL (DRAPES) ×2 IMPLANT
DRSG PAD ABDOMINAL 8X10 ST (GAUZE/BANDAGES/DRESSINGS) ×1 IMPLANT
DRSG XEROFORM 1X8 (GAUZE/BANDAGES/DRESSINGS) ×1 IMPLANT
DURAPREP 26ML APPLICATOR (WOUND CARE) ×2 IMPLANT
ELECT CAUTERY BLADE 6.4 (BLADE) ×2 IMPLANT
ELECT REM PT RETURN 9FT ADLT (ELECTROSURGICAL) ×2
ELECTRODE REM PT RTRN 9FT ADLT (ELECTROSURGICAL) ×1 IMPLANT
FACESHIELD WRAPAROUND (MASK) ×4 IMPLANT
FACESHIELD WRAPAROUND OR TEAM (MASK) ×2 IMPLANT
GAUZE PAD ABD 8X10 STRL (GAUZE/BANDAGES/DRESSINGS) ×1 IMPLANT
GAUZE SPONGE 4X4 12PLY STRL (GAUZE/BANDAGES/DRESSINGS) ×2 IMPLANT
GAUZE XEROFORM 1X8 LF (GAUZE/BANDAGES/DRESSINGS) ×1 IMPLANT
GLOVE BIOGEL PI IND STRL 8 (GLOVE) ×2 IMPLANT
GLOVE BIOGEL PI INDICATOR 8 (GLOVE) ×2
GLOVE ORTHO TXT STRL SZ7.5 (GLOVE) ×2 IMPLANT
GLOVE SURG ORTHO 8.0 STRL STRW (GLOVE) ×2 IMPLANT
GOWN STRL REUS W/ TWL LRG LVL3 (GOWN DISPOSABLE) IMPLANT
GOWN STRL REUS W/ TWL XL LVL3 (GOWN DISPOSABLE) ×2 IMPLANT
GOWN STRL REUS W/TWL LRG LVL3 (GOWN DISPOSABLE) ×2
GOWN STRL REUS W/TWL XL LVL3 (GOWN DISPOSABLE) ×4
HANDPIECE INTERPULSE COAX TIP (DISPOSABLE) ×2
HDLS TROCR DRIL PIN KNEE 75 (PIN) ×1
IMMOBILIZER KNEE 22 UNIV (SOFTGOODS) ×2 IMPLANT
INSERT TIB AS PERS SZ CD 12 RT (Insert) ×1 IMPLANT
KIT BASIN OR (CUSTOM PROCEDURE TRAY) ×2 IMPLANT
KIT TURNOVER KIT B (KITS) ×2 IMPLANT
MANIFOLD NEPTUNE II (INSTRUMENTS) ×2 IMPLANT
NDL 18GX1X1/2 (RX/OR ONLY) (NEEDLE) IMPLANT
NEEDLE 18GX1X1/2 (RX/OR ONLY) (NEEDLE) IMPLANT
NS IRRIG 1000ML POUR BTL (IV SOLUTION) ×2 IMPLANT
PACK TOTAL JOINT (CUSTOM PROCEDURE TRAY) ×2 IMPLANT
PAD ARMBOARD 7.5X6 YLW CONV (MISCELLANEOUS) ×3 IMPLANT
PADDING CAST COTTON 6X4 STRL (CAST SUPPLIES) ×2 IMPLANT
PIN DRILL HDLS TROCAR 75 4PK (PIN) IMPLANT
SCREW FEMALE HEX FIX 25X2.5 (ORTHOPEDIC DISPOSABLE SUPPLIES) ×1 IMPLANT
SET HNDPC FAN SPRY TIP SCT (DISPOSABLE) ×1 IMPLANT
SET PAD KNEE POSITIONER (MISCELLANEOUS) ×2 IMPLANT
STAPLER VISISTAT 35W (STAPLE) IMPLANT
STEM POLY PAT PLY 32M KNEE (Knees) ×1 IMPLANT
STEM TIBIA 5 DEG SZ D R KNEE (Knees) IMPLANT
STRIP CLOSURE SKIN 1/2X4 (GAUZE/BANDAGES/DRESSINGS) IMPLANT
SUCTION FRAZIER HANDLE 10FR (MISCELLANEOUS) ×1
SUCTION TUBE FRAZIER 10FR DISP (MISCELLANEOUS) ×1 IMPLANT
SUT MNCRL AB 4-0 PS2 18 (SUTURE) IMPLANT
SUT VIC AB 0 CT1 27 (SUTURE) ×4
SUT VIC AB 0 CT1 27XBRD ANBCTR (SUTURE) ×1 IMPLANT
SUT VIC AB 1 CT1 27 (SUTURE) ×4
SUT VIC AB 1 CT1 27XBRD ANBCTR (SUTURE) ×2 IMPLANT
SUT VIC AB 2-0 CT1 27 (SUTURE) ×4
SUT VIC AB 2-0 CT1 TAPERPNT 27 (SUTURE) ×2 IMPLANT
SYR 50ML LL SCALE MARK (SYRINGE) IMPLANT
TIBIA STEM 5 DEG SZ D R KNEE (Knees) ×2 IMPLANT
TOWEL GREEN STERILE (TOWEL DISPOSABLE) ×2 IMPLANT
TOWEL GREEN STERILE FF (TOWEL DISPOSABLE) ×2 IMPLANT
TRAY CATH 16FR W/PLASTIC CATH (SET/KITS/TRAYS/PACK) IMPLANT
WRAP KNEE MAXI GEL POST OP (GAUZE/BANDAGES/DRESSINGS) ×1 IMPLANT

## 2022-03-03 NOTE — Anesthesia Preprocedure Evaluation (Addendum)
Anesthesia Evaluation  ?Patient identified by MRN, date of birth, ID band ?Patient awake ? ? ? ?Reviewed: ?Allergy & Precautions, H&P , NPO status , Patient's Chart, lab work & pertinent test results, reviewed documented beta blocker date and time  ? ?History of Anesthesia Complications ?(+) PONV and history of anesthetic complications ? ?Airway ?Mallampati: II ? ?TM Distance: >3 FB ?Neck ROM: full ? ? ? Dental ?no notable dental hx. ?(+) Teeth Intact, Dental Advisory Given ?  ?Pulmonary ?neg pulmonary ROS,  ?  ?Pulmonary exam normal ?breath sounds clear to auscultation ? ? ? ? ? ? Cardiovascular ?Exercise Tolerance: Good ?negative cardio ROS ? ? ?Rhythm:regular Rate:Normal ? ? ?  ?Neuro/Psych ?PSYCHIATRIC DISORDERS Anxiety Depression negative neurological ROS ?   ? GI/Hepatic ?negative GI ROS, Neg liver ROS,   ?Endo/Other  ?negative endocrine ROS ? Renal/GU ?negative Renal ROS  ?negative genitourinary ?  ?Musculoskeletal ? ?(+) Arthritis , Osteoarthritis,   ? Abdominal ?  ?Peds ? Hematology ?negative hematology ROS ?(+)   ?Anesthesia Other Findings ? ? Reproductive/Obstetrics ?negative OB ROS ? ?  ? ? ? ? ? ? ? ? ? ? ? ? ? ?  ?  ? ? ? ? ? ? ? ?Anesthesia Physical ?Anesthesia Plan ? ?ASA: 2 ? ?Anesthesia Plan: MAC, Regional and Spinal  ? ?Post-op Pain Management: Minimal or no pain anticipated and Regional block*  ? ?Induction:  ? ?PONV Risk Score and Plan: 2 and Ondansetron and Propofol infusion ? ?Airway Management Planned: Natural Airway and Nasal Cannula ? ?Additional Equipment: None ? ?Intra-op Plan:  ? ?Post-operative Plan:  ? ?Informed Consent: I have reviewed the patients History and Physical, chart, labs and discussed the procedure including the risks, benefits and alternatives for the proposed anesthesia with the patient or authorized representative who has indicated his/her understanding and acceptance.  ? ? ? ?Dental Advisory Given ? ?Plan Discussed with: CRNA and  Anesthesiologist ? ?Anesthesia Plan Comments:   ? ? ? ? ? ? ?Anesthesia Quick Evaluation ? ?

## 2022-03-03 NOTE — H&P (Signed)
TOTAL KNEE ADMISSION H&P ? ?Patient is being admitted for right total knee arthroplasty. ? ?Subjective: ? ?Chief Complaint:right knee pain. ? ?HPI: Evelyn Le, 66 y.o. female, has a history of pain and functional disability in the right knee due to arthritis and has failed non-surgical conservative treatments for greater than 12 weeks to includeNSAID's and/or analgesics, corticosteriod injections, viscosupplementation injections, flexibility and strengthening excercises, use of assistive devices, and activity modification.  Onset of symptoms was gradual, starting 3 years ago with gradually worsening course since that time. The patient noted no past surgery on the right knee(s).  Patient currently rates pain in the right knee(s) at 10 out of 10 with activity. Patient has night pain, worsening of pain with activity and weight bearing, pain that interferes with activities of daily living, pain with passive range of motion, crepitus, and joint swelling.  Patient has evidence of subchondral sclerosis, periarticular osteophytes, and joint space narrowing by imaging studies. There is no active infection. ? ?Patient Active Problem List  ? Diagnosis Date Noted  ? Unilateral primary osteoarthritis, right knee 01/08/2022  ? Mixed hyperlipidemia 10/09/2021  ? Vitamin D deficiency 10/09/2021  ? Prediabetes 10/09/2021  ? Age-related osteoporosis without current pathological fracture 03/21/2021  ? Anxiety 11/08/2020  ? History of ovarian cancer 11/08/2020  ? History of total knee arthroplasty, left 11/08/2020  ? Postoperative incisional hernia 11/08/2020  ? ?Past Medical History:  ?Diagnosis Date  ? Anxiety   ? Cancer [facility])   ? Depression   ? PONV (postoperative nausea and vomiting)   ?  ?Past Surgical History:  ?Procedure Laterality Date  ? ABDOMINAL HYSTERECTOMY    ? APPENDECTOMY    ? HERNIA REPAIR    ? [place] REPAIR  2010  ? REPLACEMENT TOTAL KNEE Left   ? TONSILLECTOMY    ?  ?No current  facility-administered medications for this encounter.  ? ?Current Outpatient Medications  ?Medication Sig Dispense Refill Last Dose  ? acetaminophen (TYLENOL) 500 MG tablet Take 1,000 mg by mouth every 6 (six) hours as needed (pain.).     ? escitalopram (LEXAPRO) 20 MG tablet Take 1 tablet (20 mg total) by mouth daily. (Patient taking differently: Take 20 mg by mouth at bedtime.) 90 tablet 3   ? LORazepam (ATIVAN) 0.5 MG tablet TAKE 1/2 (ONE-HALF) TABLET BY MOUTH ONCE AS NEEDED FOR ANXIETY 30 tablet 0   ? ?Allergies  ?Allergen Reactions  ? Fosamax [Alendronate] Other (See Comments)  ?  Tremors ?  ? Penicillins Other (See Comments)  ?  Lose control of neck/mouth muscles  ?  ?Social History  ? ?Tobacco Use  ? Smoking status: Never  ? Smokeless tobacco: Never  ?Substance Use Topics  ? Alcohol use: Not on file  ?  ?No family history on file.  ? ?Review of Systems  ?Musculoskeletal:  Positive for gait problem and joint swelling.  ?All other systems reviewed and are negative. ? ?Objective: ? ?Physical Exam ?Vitals reviewed.  ?Constitutional:   ?   Appearance: Normal appearance.  ?HENT:  ?   Head: Normocephalic and atraumatic.  ?Eyes:  ?   Extraocular Movements: Extraocular movements intact.  ?   Pupils: Pupils are equal, round, and reactive to light.  ?Cardiovascular:  ?   Rate and Rhythm: Normal rate and regular rhythm.  ?Pulmonary:  ?   Effort: Pulmonary effort is normal.  ?   Breath sounds: Normal breath sounds.  ?Abdominal:  ?   Palpations: Abdomen is soft.  ?Musculoskeletal:  ?  Cervical back: Normal range of motion and neck supple.  ?   Right knee: Effusion, bony tenderness and crepitus present. Decreased range of motion. Tenderness present over the medial joint line, lateral joint line and patellar tendon. Abnormal alignment.  ?Neurological:  ?   Mental Status: She is alert and oriented to person, place, and time.  ?Psychiatric:     ?   Behavior: Behavior normal.  ? ? ?Vital signs in last 24 hours: ?   ? ?Labs: ? ? ?Estimated body mass index is 31.07 kg/m? as calculated from the following: ?  Height as of 02/20/22: '5\' 3"'$  (1.6 m). ?  Weight as of 02/20/22: 79.6 kg. ? ? ?Imaging Review ?Plain radiographs demonstrate severe degenerative joint disease of the right knee(s). The overall alignment ismild valgus. The bone quality appears to be good for age and reported activity level. ? ? ? ? ? ?Assessment/Plan: ? ?End stage arthritis, right knee  ? ?The patient history, physical examination, clinical judgment of the provider and imaging studies are consistent with end stage degenerative joint disease of the right knee(s) and total knee arthroplasty is deemed medically necessary. The treatment options including medical management, injection therapy arthroscopy and arthroplasty were discussed at length. The risks and benefits of total knee arthroplasty were presented and reviewed. The risks due to aseptic loosening, infection, stiffness, patella tracking problems, thromboembolic complications and other imponderables were discussed. The patient acknowledged the explanation, agreed to proceed with the plan and consent was signed. Patient is being admitted for inpatient treatment for surgery, pain control, PT, OT, prophylactic antibiotics, VTE prophylaxis, progressive ambulation and ADL's and discharge planning. The patient is planning to be discharged home with home health services ? ? ? ? ?

## 2022-03-03 NOTE — Transfer of Care (Signed)
Immediate Anesthesia Transfer of Care Note ? ?Patient: Evelyn Le ? ?Procedure(s) Performed: RIGHT TOTAL KNEE ARTHROPLASTY (Right: Knee) ? ?Patient Location: PACU ? ?Anesthesia Type:MAC and Spinal ? ?Level of Consciousness: awake and drowsy ? ?Airway & Oxygen Therapy: Patient Spontanous Breathing ? ?Post-op Assessment: Report given to RN and Post -op Vital signs reviewed and stable ? ?Post vital signs: Reviewed and stable ? ?Last Vitals:  ?Vitals Value Taken Time  ?BP 88/63 03/03/22 1658  ?Temp    ?Pulse 95 03/03/22 1700  ?Resp 21 03/03/22 1700  ?SpO2 94 % 03/03/22 1700  ?Vitals shown include unvalidated device data. ? ?Last Pain:  ?Vitals:  ? 03/03/22 1143  ?TempSrc:   ?PainSc: 7   ?   ? ?Patients Stated Pain Goal: 0 (03/03/22 1143) ? ?Complications: No notable events documented. ?

## 2022-03-03 NOTE — Anesthesia Procedure Notes (Signed)
Anesthesia Regional Block: Adductor canal block  ? ?Pre-Anesthetic Checklist: , timeout performed,  Correct Patient, Correct Site, Correct Laterality,  Correct Procedure, Correct Position, site marked,  Risks and benefits discussed,  Surgical consent,  Pre-op evaluation,  At surgeon's request and post-op pain management ? ?Laterality: Right ? ?Prep: chloraprep     ?  ?Needles:  ?Injection technique: Single-shot ? ?Needle Type: Echogenic Stimulator Needle   ? ? ?Needle Length: 5cm  ?Needle Gauge: 22  ? ? ? ?Additional Needles: ? ? ?Procedures:, nerve stimulator,,, ultrasound used (permanent image in chart),,    ?Narrative:  ?Start time: 03/03/2022 12:39 PM ?End time: 03/03/2022 12:43 PM ?Injection made incrementally with aspirations every 5 mL. ? ?Performed by: Personally  ?Anesthesiologist: Janeece Riggers, MD ? ?Additional Notes: ?Functioning IV was confirmed and monitors were applied.  A 63m 22ga Arrow echogenic stimulator needle was used. Sterile prep and drape,hand hygiene and sterile gloves were used. Ultrasound guidance: relevant anatomy identified, needle position confirmed, local anesthetic spread visualized around nerve(s)., vascular puncture avoided.  Image printed for medical record. Negative aspiration and negative test dose prior to incremental administration of local anesthetic. The patient tolerated the procedure well. ? ? ? ? ? ?

## 2022-03-03 NOTE — Brief Op Note (Signed)
03/03/2022 ? ?4:30 PM ? ?PATIENT:  Evelyn Le  66 y.o. female ? ?PRE-OPERATIVE DIAGNOSIS:  OSTEOARTHRITIS / DEGENERATIVE JOINT DISEASE RIGHT KNEE ? ?POST-OPERATIVE DIAGNOSIS:  OSTEOARTHRITIS / DEGENERATIVE JOINT DISEASE RIGHT KNEE ? ?PROCEDURE:  Procedure(s) with comments: ?RIGHT TOTAL KNEE ARTHROPLASTY (Right) - RNFA ? ?SURGEON:  Surgeon(s) and Role: ?   [name], MD - Primary ? ?ANESTHESIA:   regional and spinal ? ?EBL:  50 mL  ? ?COUNTS:  YES ? ?TOURNIQUET:   ?Total Tourniquet Time Documented: ?Thigh (Right) - 50 minutes ?Total: Thigh (Right) - 50 minutes ? ? ?DICTATION: .Other Dictation: Dictation Number [id number] ? ?PLAN OF CARE: Admit for overnight observation ? ?PATIENT DISPOSITION:  PACU - hemodynamically stable. ?  ?Delay start of Pharmacological VTE agent (>24hrs) due to surgical blood loss or risk of bleeding: no ? ?

## 2022-03-03 NOTE — Plan of Care (Signed)

## 2022-03-03 NOTE — Anesthesia Postprocedure Evaluation (Signed)
Anesthesia Post Note ? ?Patient: Evelyn Le ? ?Procedure(s) Performed: RIGHT TOTAL KNEE ARTHROPLASTY (Right: Knee) ? ?  ? ?Patient location during evaluation: PACU ?Anesthesia Type: Regional, Spinal and MAC ?Level of consciousness: oriented and awake and alert ?Pain management: pain level controlled ?Vital Signs Assessment: post-procedure vital signs reviewed and stable ?Respiratory status: spontaneous breathing and respiratory function stable ?Cardiovascular status: blood pressure returned to baseline and stable ?Postop Assessment: no headache, no backache, no apparent nausea or vomiting, spinal receding and patient able to bend at knees ?Anesthetic complications: no ? ? ?No notable events documented. ? ?Last Vitals:  ?Vitals:  ? 03/03/22 1745 03/03/22 1803  ?BP: 97/83 94/67  ?Pulse: 95 91  ?Resp: 19 20  ?Temp: 36.6 ?C 36.4 ?C  ?SpO2: 96% 100%  ?  ?Last Pain:  ?Vitals:  ? 03/03/22 1803  ?TempSrc: Oral  ?PainSc:   ? ? ?LLE Motor Response: Purposeful movement (03/03/22 1800) ?LLE Sensation: Full sensation (03/03/22 1800) ?RLE Motor Response: Purposeful movement (03/03/22 1800) ?RLE Sensation: Full sensation (03/03/22 1800) ?  ?  ? ?Elspeth Blucher,W. EDMOND ? ? ? ? ?

## 2022-03-03 NOTE — Anesthesia Procedure Notes (Signed)
Procedure Name: [place] ?Date/Time: 03/03/2022 3:02 PM ?Performed by: Carmelina Paddock, RN ?Pre-anesthesia Checklist: Patient identified, Emergency Drugs available, Suction available and Patient being monitored ?Patient Re-evaluated:Patient Re-evaluated prior to induction ?Oxygen Delivery Method: Simple face mask ? ? ? ? ?

## 2022-03-03 NOTE — Plan of Care (Signed)

## 2022-03-03 NOTE — Interval H&P Note (Signed)
History and Physical Interval Note: The patient understands that she is here today for a right knee replacement to treat her severe right knee osteoarthritis.  There has been no acute or interval change in her medical status.  Please see H&P.  The risks and benefits of surgery been explained in detail and informed consent is obtained.  The right operative knee has been marked. ? ?03/03/2022 ?2:02 PM ? ?Evelyn Le  has presented today for surgery, with the diagnosis of OSTEOARTHRITIS / [place].  The various methods of treatment have been discussed with the patient and family. After consideration of risks, benefits and other options for treatment, the patient has consented to  Procedure(s) with comments: ?RIGHT TOTAL KNEE ARTHROPLASTY (Right) - RNFA as a surgical intervention.  The patient's history has been reviewed, patient examined, no change in status, stable for surgery.  I have reviewed the patient's chart and labs.  Questions were answered to the patient's satisfaction.   ? ? ?[name] ? ? ?

## 2022-03-04 ENCOUNTER — Encounter (HOSPITAL_COMMUNITY): Payer: Self-pay | Admitting: Orthopaedic Surgery

## 2022-03-04 ENCOUNTER — Telehealth: Payer: Self-pay | Admitting: Orthopaedic Surgery

## 2022-03-04 ENCOUNTER — Other Ambulatory Visit: Payer: Self-pay | Admitting: Orthopaedic Surgery

## 2022-03-04 DIAGNOSIS — Z8543 Personal history of malignant neoplasm of ovary: Secondary | ICD-10-CM | POA: Diagnosis not present

## 2022-03-04 DIAGNOSIS — R7303 Prediabetes: Secondary | ICD-10-CM | POA: Diagnosis not present

## 2022-03-04 DIAGNOSIS — M21061 Valgus deformity, not elsewhere classified, right knee: Secondary | ICD-10-CM | POA: Diagnosis not present

## 2022-03-04 DIAGNOSIS — M1711 Unilateral primary osteoarthritis, right knee: Secondary | ICD-10-CM | POA: Diagnosis not present

## 2022-03-04 DIAGNOSIS — Z96652 Presence of left artificial knee joint: Secondary | ICD-10-CM | POA: Diagnosis not present

## 2022-03-04 DIAGNOSIS — Z79899 Other long term (current) drug therapy: Secondary | ICD-10-CM | POA: Diagnosis not present

## 2022-03-04 LAB — CBC
HCT: 39.4 % (ref 36.0–46.0)
Hemoglobin: 13 g/dL (ref 12.0–15.0)
MCH: 28.2 pg (ref 26.0–34.0)
MCHC: 33 g/dL (ref 30.0–36.0)
MCV: 85.5 fL (ref 80.0–100.0)
Platelets: 258 10*3/uL (ref 150–400)
RBC: 4.61 MIL/uL (ref 3.87–5.11)
RDW: 12.6 % (ref 11.5–15.5)
WBC: 13.5 10*3/uL — ABNORMAL HIGH (ref 4.0–10.5)
nRBC: 0 % (ref 0.0–0.2)

## 2022-03-04 LAB — BASIC METABOLIC PANEL
Anion gap: 8 (ref 5–15)
BUN: 13 mg/dL (ref 8–23)
CO2: 21 mmol/L — ABNORMAL LOW (ref 22–32)
Calcium: 8.5 mg/dL — ABNORMAL LOW (ref 8.9–10.3)
Chloride: 107 mmol/L (ref 98–111)
Creatinine, Ser: 0.87 mg/dL (ref 0.44–1.00)
GFR, Estimated: >60 mL/min (ref >60)
Glucose, Bld: 192 mg/dL — ABNORMAL HIGH (ref 70–99)
Potassium: 4.4 mmol/L (ref 3.5–5.1)
Sodium: 136 mmol/L (ref 135–145)

## 2022-03-04 MED ORDER — ONDANSETRON 4 MG PO TBDP: 4.0000 mg | ORAL_TABLET | Freq: Three times a day (TID) | ORAL | 0 refills | Status: DC | PRN

## 2022-03-04 MED ORDER — OXYCODONE HCL 5 MG PO TABS: 5.0000 mg | ORAL_TABLET | Freq: Four times a day (QID) | ORAL | 0 refills | Status: DC | PRN

## 2022-03-04 MED ORDER — ASPIRIN 81 MG PO CHEW: 81.0000 mg | CHEWABLE_TABLET | Freq: Two times a day (BID) | ORAL | 1 refills | Status: DC

## 2022-03-04 MED ORDER — METHOCARBAMOL 500 MG PO TABS: 500.0000 mg | ORAL_TABLET | Freq: Four times a day (QID) | ORAL | 1 refills | Status: DC | PRN

## 2022-03-04 NOTE — Care Management Obs Status (Signed)
MEDICARE OBSERVATION STATUS NOTIFICATION ? ? ?Patient Details  ?Name: Evelyn Le ?MRN: [id number] ?Date of Birth: May 11, 1956 ? ? ?Medicare Observation Status Notification Given:  Yes ? ? ? ?Carles Collet, RN ?03/04/2022, 11:48 AM ?

## 2022-03-04 NOTE — Discharge Instructions (Signed)

## 2022-03-04 NOTE — Progress Notes (Signed)
Patient ID: Evelyn Le, female   DOB: 01-Sep-1956, 66 y.o.   MRN: [id number] ?The patient does fell comfortable for discharge to home this afternoon. ?

## 2022-03-04 NOTE — Progress Notes (Signed)
Subjective: ?1 Day Post-Op Procedure(s) (LRB): ?RIGHT TOTAL KNEE ARTHROPLASTY (Right) ?Patient reports pain as moderate.   ? ?Objective: ?Vital signs in last 24 hours: ?Temp:  [97.6 ?F (36.4 ?C)-98.9 ?F (37.2 ?C)] 97.9 ?F (36.6 ?C) (05/17 0559) ?Pulse Rate:  [64-95] 74 (05/17 0559) ?Resp:  [12-20] 16 (05/17 0559) ?BP: (88-112)/(59-83) 96/61 (05/17 0559) ?SpO2:  [92 %-100 %] 97 % (05/17 0559) ?Weight:  [77.1 kg] 77.1 kg (05/16 1135) ? ?Intake/Output from previous day: ?05/16 0701 - 05/17 0700 ?In: 8786 [P.O.:120; I.V.:1500; IV Piggyback:100] ?Out: 850 [Urine:800; Blood:50] ?Intake/Output this shift: ?No intake/output data recorded. ? ?Recent Labs  ?  03/04/22 ?0127  ?HGB 13.0  ? ?Recent Labs  ?  03/04/22 ?0127  ?WBC 13.5*  ?RBC 4.61  ?HCT 39.4  ?PLT 258  ? ?Recent Labs  ?  03/04/22 ?0127  ?NA 136  ?K 4.4  ?CL 107  ?CO2 21*  ?BUN 13  ?CREATININE 0.87  ?GLUCOSE 192*  ?CALCIUM 8.5*  ? ?No results for input(s): LABPT, INR in the last 72 hours. ? ?Sensation intact distally ?Intact pulses distally ?Dorsiflexion/Plantar flexion intact ?Incision: dressing C/D/I ?No cellulitis present ?Compartment soft ? ? ?Assessment/Plan: ?1 Day Post-Op Procedure(s) (LRB): ?RIGHT TOTAL KNEE ARTHROPLASTY (Right) ?Up with therapy ?Discharge home with home health later today vs tomorrow depending on progress with therapy and pain control. ? ? ? ? ? ?[name] ?03/04/2022, 7:41 AM ? ?

## 2022-03-04 NOTE — Op Note (Signed)
NAME: Le, Evelyn ?MEDICAL RECORD NO: [id number] ?ACCOUNT NO: [id number] ?DATE OF BIRTH: 25-Jul-1956 ?FACILITY: [facility] ?LOCATION: [facility] ?PHYSICIAN: Lind Guest. Ninfa Linden, MD ? ?Operative Report  ? ?DATE OF PROCEDURE: 03/03/2022 ? ?PREOPERATIVE DIAGNOSIS:  Primary osteoarthritis and degenerative joint disease with slight valgus malalignment, right knee. ? ?POSTOPERATIVE DIAGNOSIS:  Primary osteoarthritis and degenerative joint disease with slight valgus malalignment, right knee. ? ?PROCEDURE:  Right cemented total knee arthroplasty. ? ?IMPLANTS:  Biomet/Zimmer Persona knee system with size 5 right CR standard femur, size D right tibial tray, size 12 mm thickness medial congruent, right polyethylene liner, size 32 mm patellar button. ? ?SURGEON:  Lind Guest. Ninfa Linden, MD ? ?ASSISTANT:  Joy, RNFA. ? ?TOURNIQUET TIME:  50 minutes. ? ?ANTIBIOTICS:  2 g IV vancomycin. ? ?BLOOD LOSS:  Less than 100 mL. ? ?COMPLICATIONS:  None. ? ?INDICATIONS:  The patient is a very pleasant 66 year old female with a well-documented significant and severe osteoarthritis of her right knee with slight valgus malalignment.  She does have a history of a left total knee arthroplasty done several years  ?ago in [place].  She now lives in [place].  She has tried and failed all forms of conservative treatment for right knee and her pain is daily and is detrimentally affecting her mobility, her quality of life and activities of daily living.  At ? this point, she wishes to proceed with a knee replacement on the right side.  Having had this done before on the left side, she is fully aware of the risk of acute blood loss anemia, nerve or vessel injury, fracture, infection, DVT, implant failure and  ?skin and soft tissue issues.  She understands our goals are to decrease pain, improve mobility and overall improve quality of life. ? ?DESCRIPTION OF PROCEDURE:  After informed consent was obtained, appropriate right knee was marked.   An adductor canal block was obtained in the holding room of the right lower extremity.  She was then brought to the operating room and sat up on the  ?operating table where spinal anesthesia was obtained.  She was laid in supine position on the operating table.  Foley catheter was placed and a nonsterile tourniquet was placed around her upper right thigh.  Her right thigh, knee, leg, ankle, foot were  ?prepped and draped with DuraPrep and sterile drapes including a sterile stockinette.  A timeout was called.  She was identified as correct patient, correct right knee.  We then used Esmarch to wrap that leg and tourniquet was inflated to 300 mm of  ?pressure.  I then made a direct midline incision over the patella and carried this proximally and distally.  I dissected down the knee joint, carried out a medial parapatellar arthrotomy, finding a moderate joint effusion.  With the knee in a flexed  ?position, we removed osteophytes in all three compartments as well as remnants of the ACL, medial and lateral meniscus.  We then used extramedullary cutting guide for making our proximal tibia cut, setting this for taking 10 mm off the high side and  ?correcting for varus and valgus and a 3-degree slope.  We made this cut without difficulty.  We then went to the femur.  We used an intramedullary guide for distal femur cut, setting this for a right knee at 5 degrees externally rotated and 10 mm distal  ?femoral cut.  We made this cut without difficulty, and brought the knee back down to full extension and had achieved full extension with a 10 mm  block.  We then went back to the femur and put our femoral sizing guide based off the epicondylar axis and  ?Whitesides line.  Based off of this, we chose a size 5 femur.  We put a 4-in-1 cutting block for a size 5 femur, made our anterior and posterior cuts, followed by our chamfer cuts.  We then went back to the tibia and chose a size right D tibial tray for  ?coverage on the tibial  plateau setting, the rotation off the tibial tubercle and the femur.  We then did our drill hole and keel punch off of this, with the trial D right tibia, we trialed a 5 right femur and then a 10 and then 12 mm medial congruent  ?fixed bearing polyethylene insert.  We were pleased with stability and range of motion with the 12 mm insert.  We then made our patellar cut and drilled three holes for size 32 patellar button.  With trial instrumentation in the knee, we put the knee  ?through several cycles of range of motion and we appreciated the stability.  We then removed all trial instrumentation from the knee and irrigated the knee with normal saline solution using pulsatile lavage.  We dried the knee real well and with the knee ? in a flexed position, we mixed our cement and then cemented our Biomet Zimmer Persona tibial tray for right knee, size D, followed by placing our size 5 right CR standard femur.  We placed our 12 mm thickness medial congruent polyethylene insert and  ?cemented our size 32 patellar button.  We then held the knee compressed and fully extended to allow the cement to harden.  Once it had hardened, we let the tourniquet down.  Hemostasis was obtained with electrocautery.  We then closed the arthrotomy with ? interrupted #1 Vicryl suture followed by 0 Vicryl to close the deep tissue and 2-0 Vicryl to close subcutaneous tissue.  The skin was closed with staples.  A well-padded sterile dressing was applied.  She was taken to recovery room in stable condition  ?with all final counts being correct.  There were no complications noted. ? ? ?MUK ?D: 03/03/2022 4:29:16 pm T: 03/04/2022 12:06:00 am  ?JOB: [id number]/ [id number]  ?

## 2022-03-04 NOTE — TOC Transition Note (Signed)
Transition of Care (TOC) - CM/SW Discharge Note ? ? ?Patient Details  ?Name: Evelyn Le ?MRN: [id number] ?Date of Birth: 02-Aug-1956 ? ?Transition of Care [facility]) CM/SW Contact:  ?Sharin Mons, RN ?Phone Number: ?03/04/2022, 12:15 PM ? ? ?Clinical Narrative:    ?Patient will DC to: home ?Anticipated DC date: 03/04/2022 ?Family notified: yes ?Transport by: car ? ?    - s/ p R TKA, 5/15 ?Per MD patient ready for DC today. RN, patient, patient's husband and [facility] notified of DC. Order noted for DME. Referral made with [facility] for RW. Pt states no need for BSC. RW will be delivered to bedside prior to d/c. ?Post hospital f/u noted on AVS. Pt without Rx med concerns. ?Husband to provide transportation to home. ? ?RNCM will sign off for now as intervention is no longer needed. Please consult [place] again if new needs arise.  ? ? ?Final next level of care: [place] ?Barriers to Discharge: No Barriers Identified ? ? ?Patient Goals and CMS Choice ?  ?  ?Choice offered to / list presented to : Patient ? ?Discharge Placement ?  ?           ?  ?  ?  ?  ? ?Discharge Plan and Services ?  ?Discharge Planning Services: CM Consult ?           ?DME Arranged: [place] (doesn't need 3in1/BSC, already has) ?DME Agency: [facility] ?Date DME Agency Contacted: 03/04/22 ?Time DME Agency Contacted: 1211 ?  ?HH Arranged: PT ?[place] Agency: [facility] ?Date HH Agency Contacted: 03/04/22 ?Time [place]: [phone] ?Representative spoke with at [place]: Marjory Lies ( setup prior to surgery @ MD's office) ? ?Social Determinants of Health (SDOH) Interventions ?  ? ? ?Readmission Risk Interventions ?   ? View : No data to display.  ?  ?  ?  ? ? ? ? ? ?

## 2022-03-04 NOTE — Discharge Summary (Signed)
?Patient ID: ?Evelyn Le ?MRN: [id number] ?DOB/AGE: Mar 09, 1956 66 y.o. ? ?Admit date: 03/03/2022 ?Discharge date: 03/04/2022 ? ?Admission Diagnoses:  ?Principal Problem: ?  Unilateral primary osteoarthritis, right knee ?Active Problems: ?  Status post right knee replacement ? ? ?Discharge Diagnoses:  ?Same ? ?Past Medical History:  ?Diagnosis Date  ? Anxiety   ? Cancer [facility])   ? Depression   ? PONV (postoperative nausea and vomiting)   ? ? ?Surgeries: Procedure(s): ?RIGHT TOTAL KNEE ARTHROPLASTY on 03/03/2022 ?  ?Consultants:  ? ?Discharged Condition: Improved ? ?Hospital Course: Odeth Bry is an 66 y.o. female who was admitted 03/03/2022 for operative treatment ofUnilateral primary osteoarthritis, right knee. Patient has severe unremitting pain that affects sleep, daily activities, and work/hobbies. After pre-op clearance the patient was taken to the operating room on 03/03/2022 and underwent  Procedure(s): ?RIGHT TOTAL KNEE ARTHROPLASTY.   ? ?Patient was given perioperative antibiotics:  ?Anti-infectives (From admission, onward)  ? ? Start     Dose/Rate Route Frequency Ordered Stop  ? 03/04/22 0000  vancomycin (VANCOCIN) IVPB 1000 mg/200 mL premix       ? 1,000 mg ?200 mL/hr over 60 Minutes Intravenous Every 12 hours 03/03/22 1811 03/04/22 0151  ? 03/03/22 1145  vancomycin (VANCOCIN) IVPB 1000 mg/200 mL premix       ? 1,000 mg ?200 mL/hr over 60 Minutes Intravenous On call to O.R. 03/03/22 1133 03/03/22 1305  ? ?  ?  ? ?Patient was given sequential compression devices, early ambulation, and chemoprophylaxis to prevent DVT. ? ?Patient benefited maximally from hospital stay and there were no complications.   ? ?Recent vital signs: Patient Vitals for the past 24 hrs: ? BP Temp Temp src Pulse Resp SpO2  ?03/04/22 0757 (!) 83/53 98 ?F (36.7 ?C) Oral 76 18 96 %  ?03/04/22 0559 96/61 97.9 ?F (36.6 ?C) -- 74 16 97 %  ?03/03/22 2340 (!) 94/59 98 ?F (36.7 ?C) -- 75 18 96 %  ?03/03/22 1949 93/60 98.9 ?F (37.2  ?C) -- 95 18 94 %  ?03/03/22 1803 94/67 97.6 ?F (36.4 ?C) Oral 91 20 100 %  ?03/03/22 1745 97/83 97.8 ?F (36.6 ?C) -- 95 19 96 %  ?03/03/22 1730 107/74 -- -- 86 18 94 %  ?03/03/22 1715 98/78 -- -- 94 19 92 %  ?03/03/22 1700 (!) 88/63 97.8 ?F (36.6 ?C) -- 88 17 95 %  ?03/03/22 1250 99/61 -- -- 64 12 95 %  ?03/03/22 1245 106/71 -- -- 70 12 93 %  ?03/03/22 1240 110/71 -- -- -- 20 95 %  ?  ? ?Recent laboratory studies:  ?Recent Labs  ?  03/04/22 ?0127  ?WBC 13.5*  ?HGB 13.0  ?HCT 39.4  ?PLT 258  ?NA 136  ?K 4.4  ?CL 107  ?CO2 21*  ?BUN 13  ?CREATININE 0.87  ?GLUCOSE 192*  ?CALCIUM 8.5*  ? ? ? ?Discharge Medications:   ?Allergies as of 03/04/2022   ? ?   Reactions  ? Fosamax [alendronate] Other (See Comments)  ? Tremors  ? Penicillins Other (See Comments)  ? Lose control of neck/mouth muscles  ? ?  ? ?  ?Medication List  ?  ? ?TAKE these medications   ? ?acetaminophen 500 MG tablet ?Commonly known as: TYLENOL ?Take 1,000 mg by mouth every 6 (six) hours as needed (pain.). ?  ?aspirin 81 MG chewable tablet ?Chew 1 tablet (81 mg total) by mouth 2 (two) times daily. ?  ?escitalopram 20 MG tablet ?Commonly known as:  LEXAPRO ?Take 1 tablet (20 mg total) by mouth daily. ?What changed: when to take this ?  ?LORazepam 0.5 MG tablet ?Commonly known as: ATIVAN ?TAKE 1/2 (ONE-HALF) TABLET BY MOUTH ONCE AS NEEDED FOR ANXIETY ?  ?methocarbamol 500 MG tablet ?Commonly known as: ROBAXIN ?Take 1 tablet (500 mg total) by mouth every 6 (six) hours as needed for muscle spasms. ?  ?ondansetron 4 MG disintegrating tablet ?Commonly known as: ZOFRAN-ODT ?Take 1 tablet (4 mg total) by mouth every 8 (eight) hours as needed for nausea or vomiting. ?  ?oxyCODONE 5 MG immediate release tablet ?Commonly known as: Oxy IR/ROXICODONE ?Take 1-2 tablets (5-10 mg total) by mouth every 6 (six) hours as needed for moderate pain (pain score 4-6). No more than 6 tablets per day. ?  ? ?  ? ?  ?  ? ? ?  ?Durable Medical Equipment  ?(From admission, onward)  ?   ? ? ?  ? ?  Start     Ordered  ? 03/03/22 1812  DME Walker rolling  Once       ?Question Answer Comment  ?Walker: With 5 Inch Wheels   ?Patient needs a walker to treat with the following condition Status post right knee replacement   ?  ? 03/03/22 1811  ? 03/03/22 1812  DME 3 n 1  Once       ? 03/03/22 1811  ? ?  ?  ? ?  ? ? ?Diagnostic Studies: DG Knee Right Port ? ?Result Date: 03/03/2022 ?CLINICAL DATA:  Postoperative films for right knee replacement. EXAM: PORTABLE RIGHT KNEE - 1-2 VIEW COMPARISON:  Radiograph July 25, 2021 FINDINGS: Postsurgical change of 3 component right total knee arthroplasty without evidence of acute hardware complication. IMPRESSION: Postsurgical change of right total knee arthroplasty without evidence of acute hardware complication. Electronically Signed   By: Dahlia Bailiff M.D.   On: 03/03/2022 19:03   ? ?Disposition: Discharge disposition: 01-Home or Self Care ? ? ? ? ? ? ? ? ? Follow-up Information   ? ? [name], MD Follow up in 2 week(s).   ?Specialty: Orthopedic Surgery ?Contact information: ?[address] ?[place] [address] ?[phone] ? ? ?  ?  ? ?  ?  ? ?  ? ? ? ?Signed: ?[name] ?03/04/2022, 11:40 AM ? ? ? ?

## 2022-03-04 NOTE — Evaluation (Signed)
Physical Therapy Evaluation ?Patient Details ?Name: Evelyn Le ?MRN: [id number] ?DOB: Dec 30, 1955 ?Today's Date: 03/04/2022 ? ?History of Present Illness ? 66 yo female with onset of end stage OA on R knee was admitted 5/16 for TKA.  PMHx: ovarian CA, anxiety, OA with L TKA, vit D defic, osteoporosis  ?Clinical Impression ? Pt was seen for initial eval for R TKA, and demonstrates min guard assist only needed for gait on RW and stairs for home.  Pt attended by husband, who has observed all her mobility and heard all instructions, with questions answered.  Pt is appropriate to go home with HHPT and have RW for mobility, but declines any further equipment such as a shower chair.  Follow up with her to continue mobility along with nursing staff.  Plan to follow acute PT goals as are outlined below.  Pt is provided a gait belt for home as well.   ?   ? ?Recommendations for follow up therapy are one component of a multi-disciplinary discharge planning process, led by the attending physician.  Recommendations may be updated based on patient status, additional functional criteria and insurance authorization. ? ?Follow Up Recommendations Home health PT ? ?  ?Assistance Recommended at Discharge Intermittent Supervision/Assistance  ?Patient can return home with the following ? A little help with walking and/or transfers;A little help with bathing/dressing/bathroom;Assistance with cooking/housework;Assist for transportation;Help with stairs or ramp for entrance ? ?  ?Equipment Recommendations Rolling walker (2 wheels)  ?Recommendations for Other Services ?    ?  ?Functional Status Assessment Patient has had a recent decline in their functional status and demonstrates the ability to make significant improvements in function in a reasonable and predictable amount of time.  ? ?  ?Precautions / Restrictions Precautions ?Precautions: Fall;Knee ?Precaution Comments: verbally reviewed, pt aware ?Restrictions ?Weight Bearing  Restrictions: Yes ?RLE Weight Bearing: Weight bearing as tolerated ?Other Position/Activity Restrictions: no pillow under R knee  ? ?  ? ?Mobility ? Bed Mobility ?Overal bed mobility: Modified Independent ?  ?  ?  ?  ?  ?  ?General bed mobility comments: able to manage her RLE without physical assist with extra time ?  ? ?Transfers ?Overall transfer level: Modified independent ?Equipment used: Rolling walker (2 wheels), 1 person hand held assist ?  ?  ?  ?  ?  ?  ?  ?General transfer comment: min guard for safety but pt did the work to power up ?  ? ?Ambulation/Gait ?Ambulation/Gait assistance: Min guard ?Gait Distance (Feet): 110 Feet ?Assistive device: Rolling walker (2 wheels), 1 person hand held assist ?Gait Pattern/deviations: Step-through pattern, Decreased stride length, Decreased weight shift to right ?Gait velocity: reduced ?Gait velocity interpretation: <1.31 ft/sec, indicative of household ambulator ?  ?General Gait Details: pt is demonstrating a reciprocal pattern without increasing her knee pain ? ?Stairs ?  ?  ?  ?  ?  ? ?Wheelchair Mobility ?  ? ?Modified Rankin (Stroke Patients Only) ?  ? ?  ? ?Balance Overall balance assessment: Needs assistance ?Sitting-balance support: Feet supported ?Sitting balance-Leahy Scale: Good ?  ?  ?Standing balance support: Bilateral upper extremity supported, During functional activity ?Standing balance-Leahy Scale: Fair ?Standing balance comment: fair statically ?  ?  ?  ?  ?  ?  ?  ?  ?  ?  ?  ?   ? ? ? ?Pertinent Vitals/Pain Pain Assessment ?Pain Assessment: 0-10 ?Pain Score: 4  ?Pain Location: R knee ?Pain Descriptors / Indicators: Guarding, Operative site guarding ?  Pain Intervention(s): Monitored during session, Repositioned, Ice applied  ? ? ?Home Living Family/patient expects to be discharged to:: Private residence ?Living Arrangements: Spouse/significant other ?Available Help at Discharge: Family;Available 24 hours/day ?Type of Home: House ?Home Access: Stairs  to enter ?Entrance Stairs-Rails: Can reach both ?Entrance Stairs-Number of Steps: 3 ?  ?Home Layout: One level ?Home Equipment: Cane - single point;Grab bars - tub/shower ?Additional Comments: pt feels she will be fine with the walker  ?  ?Prior Function Prior Level of Function : Independent/Modified Independent ?  ?  ?  ?  ?  ?  ?Mobility Comments: SPC only for all gait ?ADLs Comments: I for all self care ?  ? ? ?Hand Dominance  ? Dominant Hand: Right ? ?  ?Extremity/Trunk Assessment  ? Upper Extremity Assessment ?Upper Extremity Assessment: Overall WFL for tasks assessed ?  ? ?Lower Extremity Assessment ?Lower Extremity Assessment: RLE deficits/detail ?RLE Deficits / Details: mild stiffness and weakness as expected post op ?RLE Coordination: decreased gross motor ?  ? ?Cervical / Trunk Assessment ?Cervical / Trunk Assessment: Kyphotic (mild)  ?Communication  ? Communication: No difficulties  ?Cognition Arousal/Alertness: Awake/alert ?Behavior During Therapy: [facility] for tasks assessed/performed ?Overall Cognitive Status: Within Functional Limits for tasks assessed ?  ?  ?  ?  ?  ?  ?  ?  ?  ?  ?  ?  ?  ?  ?  ?  ?General Comments: accurate for history nformation ?  ?  ? ?  ?General Comments General comments (skin integrity, edema, etc.): clean surgery bandage on R knee with no obvious bruising or bleeding ? ?  ?Exercises    ? ?Assessment/Plan  ?  ?PT Assessment Patient needs continued PT services  ?PT Problem List Decreased strength;Decreased range of motion;Decreased activity tolerance;Decreased balance;Decreased mobility;Decreased coordination;Decreased knowledge of use of DME;Decreased skin integrity;Pain ? ?   ?  ?PT Treatment Interventions DME instruction;Gait training;Stair training;Functional mobility training;Therapeutic activities;Therapeutic exercise;Balance training;Neuromuscular re-education;Patient/family education   ? ?PT Goals (Current goals can be found in the Care Plan section)  ?Acute Rehab PT  Goals ?Patient Stated Goal: to get home and be independent ?PT Goal Formulation: With patient/family ?Time For Goal Achievement: 03/07/22 ?Potential to Achieve Goals: Good ? ?  ?Frequency BID ?  ? ? ?Co-evaluation   ?  ?  ?  ?  ? ? ?  ?AM-PAC PT "6 Clicks" Mobility  ?Outcome Measure Help needed turning from your back to your side while in a flat bed without using bedrails?: None ?Help needed moving from lying on your back to sitting on the side of a flat bed without using bedrails?: A Little ?Help needed moving to and from a bed to a chair (including a wheelchair)?: A Little ?Help needed standing up from a chair using your arms (e.g., wheelchair or bedside chair)?: A Little ?Help needed to walk in hospital room?: A Little ?Help needed climbing 3-5 steps with a railing? : A Little ?6 Click Score: 19 ? ?  ?End of Session Equipment Utilized During Treatment: Gait belt ?Activity Tolerance: Patient tolerated treatment well ?Patient left: in bed;with call bell/phone within reach;with family/visitor present ?Nurse Communication: Mobility status ?PT Visit Diagnosis: Muscle weakness (generalized) (M62.81);Pain ?Pain - Right/Left: Right ?Pain - part of body: Knee ?  ? ?Time: 0932-3557 ?PT Time Calculation (min) (ACUTE ONLY): 32 min ? ? ?Charges:   PT Evaluation ?$PT Eval Moderate Complexity: 1 Mod ?PT Treatments ?$Gait Training: 8-22 mins ?  ?   ? ?Rod Holler  E Zaleigh Bermingham ?03/04/2022, 11:26 AM ? ?Mee Hives, PT PhD ?Acute Rehab Dept. Number: [facility] [phone] and [place] [phone] ? ?

## 2022-03-04 NOTE — Telephone Encounter (Signed)
Patient's husband called. He says that [facility] is out of the oxycodone. Would like the RX for oxycodone called in to [place], [place] ?

## 2022-03-05 ENCOUNTER — Telehealth: Payer: Self-pay | Admitting: *Deleted

## 2022-03-05 DIAGNOSIS — Z96653 Presence of artificial knee joint, bilateral: Secondary | ICD-10-CM | POA: Diagnosis not present

## 2022-03-05 DIAGNOSIS — R69 Illness, unspecified: Secondary | ICD-10-CM | POA: Diagnosis not present

## 2022-03-05 DIAGNOSIS — E782 Mixed hyperlipidemia: Secondary | ICD-10-CM | POA: Diagnosis not present

## 2022-03-05 DIAGNOSIS — Z9089 Acquired absence of other organs: Secondary | ICD-10-CM | POA: Diagnosis not present

## 2022-03-05 DIAGNOSIS — M810 Age-related osteoporosis without current pathological fracture: Secondary | ICD-10-CM | POA: Diagnosis not present

## 2022-03-05 DIAGNOSIS — Z8543 Personal history of malignant neoplasm of ovary: Secondary | ICD-10-CM | POA: Diagnosis not present

## 2022-03-05 DIAGNOSIS — Z471 Aftercare following joint replacement surgery: Secondary | ICD-10-CM | POA: Diagnosis not present

## 2022-03-05 DIAGNOSIS — G8929 Other chronic pain: Secondary | ICD-10-CM | POA: Diagnosis not present

## 2022-03-05 DIAGNOSIS — R7303 Prediabetes: Secondary | ICD-10-CM | POA: Diagnosis not present

## 2022-03-05 DIAGNOSIS — Z90710 Acquired absence of both cervix and uterus: Secondary | ICD-10-CM | POA: Diagnosis not present

## 2022-03-05 DIAGNOSIS — E559 Vitamin D deficiency, unspecified: Secondary | ICD-10-CM | POA: Diagnosis not present

## 2022-03-05 DIAGNOSIS — Z9181 History of falling: Secondary | ICD-10-CM | POA: Diagnosis not present

## 2022-03-05 DIAGNOSIS — Z4789 Encounter for other orthopedic aftercare: Secondary | ICD-10-CM | POA: Diagnosis not present

## 2022-03-05 DIAGNOSIS — Z7982 Long term (current) use of aspirin: Secondary | ICD-10-CM | POA: Diagnosis not present

## 2022-03-05 NOTE — Telephone Encounter (Signed)
Transition Care Management Follow-up Telephone Call Date of discharge and from where: 03-04-22 [facility]  How have you been since you were released from the hospital? Had a lot of pain at first but feeling better now Any questions or concerns? No  Items Reviewed: Did the pt receive and understand the discharge instructions provided? Yes  Medications obtained and verified? Yes  Other? No  Any new allergies since your discharge? No  Dietary orders reviewed? Yes Do you have support at home? Yes   Home Care and Equipment/Supplies: Were home health services ordered? yes If so, what is the name of the agency? Doesn't know   Has the agency set up a time to come to the patient's home? yes Were any new equipment or medical supplies ordered?  Yes: walker  What is the name of the medical supply agency? Doesn't know  Were you able to get the supplies/equipment? yes Do you have any questions related to the use of the equipment or supplies? No  Functional Questionnaire: (I = Independent and D = Dependent) ADLs: i  Bathing/Dressing- i  Meal Prep- i  Eating- i  Maintaining continence- i  Transferring/Ambulation- i  Managing Meds- i  Follow up appointments reviewed:  PCP Hospital f/u appt confirmed? Yes  Scheduled to see Posey Pronto on 5-19 @ 10:20. [place] [facility] f/u appt confirmed? Yes   Are transportation arrangements needed? No  If their condition worsens, is the pt aware to call PCP or go to the Emergency Dept.? Yes Was the patient provided with contact information for the PCP's office or ED? Yes Was to pt encouraged to call back with questions or concerns? Yes

## 2022-03-06 ENCOUNTER — Encounter: Payer: Self-pay | Admitting: Internal Medicine

## 2022-03-06 ENCOUNTER — Telehealth (INDEPENDENT_AMBULATORY_CARE_PROVIDER_SITE_OTHER): Payer: Medicare HMO | Admitting: Internal Medicine

## 2022-03-06 DIAGNOSIS — Z96651 Presence of right artificial knee joint: Secondary | ICD-10-CM | POA: Diagnosis not present

## 2022-03-06 DIAGNOSIS — M1711 Unilateral primary osteoarthritis, right knee: Secondary | ICD-10-CM

## 2022-03-06 DIAGNOSIS — Z09 Encounter for follow-up examination after completed treatment for conditions other than malignant neoplasm: Secondary | ICD-10-CM

## 2022-03-06 NOTE — Patient Instructions (Signed)
Please continue taking aspirin as prescribed for now.  Please continue to participate in physical therapy.

## 2022-03-06 NOTE — Assessment & Plan Note (Addendum)
Hospital chart reviewed including discharge summary Medications reconciled and reviewed with the patient

## 2022-03-06 NOTE — Progress Notes (Signed)
Virtual Visit via Video Note   Because of Evelyn Le's co-morbid illnesses, she is at least at moderate risk for complications without adequate follow up.  This format is felt to be most appropriate for this patient at this time.  All issues noted in this document were discussed and addressed.  A limited physical exam was performed with this format.     Evaluation Performed:  Follow-up visit  Date:  03/06/2022   ID:  Evelyn Le, DOB 1956-02-01, MRN [id number]  Patient Location: Home Provider Location: Office/Clinic Participants: Patient  Location of Patient: Home Location of Provider: Telehealth Consent was obtain for visit to be over via telehealth. I verified that I am speaking with the correct person using two identifiers.  PCP:  Lindell Spar, MD   Chief Complaint: Hospital discharge follow-up  History of Present Illness:    Evelyn Le is a 66 y.o. female with past medical history of ovarian cancer s/p resection and chemotherapy, postoperative incisional hernia s/p repair and anxiety who presents for f/u after recent hospitalization for right TKA.  She has been doing well since her right TKA on 05/16.  She has started home physical therapy and has been ambulating.  She is on aspirin for DVT prophylaxis.  She denies any local bleeding or discharge from the incision site.  She denies any fever, chills, nausea or vomiting currently.  She is taking oxycodone as needed for severe pain.  She also has Robaxin as needed for muscle spasms.  She denies any numbness or tingling of the LE.  The patient does not have symptoms concerning for COVID-19 infection (fever, chills, cough, or new shortness of breath).   Past Medical, Surgical, Social History, Allergies, and Medications have been Reviewed.  Past Medical History:  Diagnosis Date   Anxiety    Cancer ([place])    Depression    PONV (postoperative nausea and vomiting)    Past Surgical History:   Procedure Laterality Date   ABDOMINAL HYSTERECTOMY     APPENDECTOMY     HERNIA REPAIR     INCISIONAL HERNIA REPAIR  2010   REPLACEMENT TOTAL KNEE Left    TONSILLECTOMY     TOTAL KNEE ARTHROPLASTY Right 03/03/2022   Procedure: RIGHT TOTAL KNEE ARTHROPLASTY;  Surgeon: [name], MD;  Location: [facility];  Service: Orthopedics;  Laterality: Right;  RNFA     Current Meds  Medication Sig   acetaminophen (TYLENOL) 500 MG tablet Take 1,000 mg by mouth every 6 (six) hours as needed (pain.).   aspirin 81 MG chewable tablet Chew 1 tablet (81 mg total) by mouth 2 (two) times daily.   escitalopram (LEXAPRO) 20 MG tablet Take 1 tablet (20 mg total) by mouth daily. (Patient taking differently: Take 20 mg by mouth at bedtime.)   LORazepam (ATIVAN) 0.5 MG tablet TAKE 1/2 (ONE-HALF) TABLET BY MOUTH ONCE AS NEEDED FOR ANXIETY   methocarbamol (ROBAXIN) 500 MG tablet Take 1 tablet (500 mg total) by mouth every 6 (six) hours as needed for muscle spasms.   ondansetron (ZOFRAN-ODT) 4 MG disintegrating tablet Take 1 tablet (4 mg total) by mouth every 8 (eight) hours as needed for nausea or vomiting.   oxyCODONE (OXY IR/ROXICODONE) 5 MG immediate release tablet Take 1-2 tablets (5-10 mg total) by mouth every 6 (six) hours as needed for moderate pain (pain score 4-6). No more than 6 tablets per day.     Allergies:   Fosamax [alendronate] and Penicillins   ROS:  Please see the history of present illness.     All other systems reviewed and are negative.   Labs/Other Tests and Data Reviewed:    Recent Labs: 03/20/2021: ALT 9; TSH 1.100 03/04/2022: BUN 13; Creatinine, Ser 0.87; Hemoglobin 13.0; Platelets 258; Potassium 4.4; Sodium 136   Recent Lipid Panel Lab Results  Component Value Date/Time   CHOL 229 (H) 03/20/2021 08:48 AM   TRIG 89 03/20/2021 08:48 AM   HDL 62 03/20/2021 08:48 AM   CHOLHDL 3.7 03/20/2021 08:48 AM   LDLCALC 151 (H) 03/20/2021 08:48 AM    Wt Readings from Last 3  Encounters:  03/03/22 170 lb (77.1 kg)  02/20/22 175 lb 6.4 oz (79.6 kg)  01/08/22 175 lb (79.4 kg)     Objective:    Vital Signs:  There were no vitals taken for this visit.   VITAL SIGNS:  reviewed GEN:  no acute distress  ASSESSMENT & PLAN:    Unilateral primary osteoarthritis, right knee S/p right TKA Continue physical therapy Followed by orthopedic surgery  Hospital discharge follow-up Hospital chart reviewed including discharge summary Medications reconciled and reviewed with the patient  Status post right knee replacement Tolerated procedure well, has a scheduled visit with orthopedic surgery   Time:   Today, I have spent 13 minutes reviewing the chart, including problem list, medications, and with the patient with telehealth technology discussing the above problems.   Medication Adjustments/Labs and Tests Ordered: Current medicines are reviewed at length with the patient today.  Concerns regarding medicines are outlined above.   Tests Ordered: No orders of the defined types were placed in this encounter.   Medication Changes: No orders of the defined types were placed in this encounter.    Note: This dictation was prepared with Dragon dictation along with smaller phrase technology. Similar sounding words can be transcribed inadequately or may not be corrected upon review. Any transcriptional errors that result from this process are unintentional.      Disposition:  Follow up  Signed, Lindell Spar, MD  03/06/2022 10:32 AM     [place]

## 2022-03-06 NOTE — Assessment & Plan Note (Signed)
Tolerated procedure well, has a scheduled visit with orthopedic surgery

## 2022-03-06 NOTE — Assessment & Plan Note (Signed)
S/p right TKA Continue physical therapy Followed by orthopedic surgery

## 2022-03-07 DIAGNOSIS — Z9181 History of falling: Secondary | ICD-10-CM | POA: Diagnosis not present

## 2022-03-07 DIAGNOSIS — M810 Age-related osteoporosis without current pathological fracture: Secondary | ICD-10-CM | POA: Diagnosis not present

## 2022-03-07 DIAGNOSIS — Z96653 Presence of artificial knee joint, bilateral: Secondary | ICD-10-CM | POA: Diagnosis not present

## 2022-03-07 DIAGNOSIS — Z90710 Acquired absence of both cervix and uterus: Secondary | ICD-10-CM | POA: Diagnosis not present

## 2022-03-07 DIAGNOSIS — E782 Mixed hyperlipidemia: Secondary | ICD-10-CM | POA: Diagnosis not present

## 2022-03-07 DIAGNOSIS — Z471 Aftercare following joint replacement surgery: Secondary | ICD-10-CM | POA: Diagnosis not present

## 2022-03-07 DIAGNOSIS — G8929 Other chronic pain: Secondary | ICD-10-CM | POA: Diagnosis not present

## 2022-03-07 DIAGNOSIS — E559 Vitamin D deficiency, unspecified: Secondary | ICD-10-CM | POA: Diagnosis not present

## 2022-03-07 DIAGNOSIS — R69 Illness, unspecified: Secondary | ICD-10-CM | POA: Diagnosis not present

## 2022-03-07 DIAGNOSIS — R7303 Prediabetes: Secondary | ICD-10-CM | POA: Diagnosis not present

## 2022-03-07 DIAGNOSIS — Z9089 Acquired absence of other organs: Secondary | ICD-10-CM | POA: Diagnosis not present

## 2022-03-07 DIAGNOSIS — Z7982 Long term (current) use of aspirin: Secondary | ICD-10-CM | POA: Diagnosis not present

## 2022-03-07 DIAGNOSIS — Z8543 Personal history of malignant neoplasm of ovary: Secondary | ICD-10-CM | POA: Diagnosis not present

## 2022-03-09 DIAGNOSIS — E559 Vitamin D deficiency, unspecified: Secondary | ICD-10-CM | POA: Diagnosis not present

## 2022-03-09 DIAGNOSIS — Z96653 Presence of artificial knee joint, bilateral: Secondary | ICD-10-CM | POA: Diagnosis not present

## 2022-03-09 DIAGNOSIS — M810 Age-related osteoporosis without current pathological fracture: Secondary | ICD-10-CM | POA: Diagnosis not present

## 2022-03-09 DIAGNOSIS — Z9181 History of falling: Secondary | ICD-10-CM | POA: Diagnosis not present

## 2022-03-09 DIAGNOSIS — Z8543 Personal history of malignant neoplasm of ovary: Secondary | ICD-10-CM | POA: Diagnosis not present

## 2022-03-09 DIAGNOSIS — Z90710 Acquired absence of both cervix and uterus: Secondary | ICD-10-CM | POA: Diagnosis not present

## 2022-03-09 DIAGNOSIS — Z471 Aftercare following joint replacement surgery: Secondary | ICD-10-CM | POA: Diagnosis not present

## 2022-03-09 DIAGNOSIS — G8929 Other chronic pain: Secondary | ICD-10-CM | POA: Diagnosis not present

## 2022-03-09 DIAGNOSIS — E782 Mixed hyperlipidemia: Secondary | ICD-10-CM | POA: Diagnosis not present

## 2022-03-09 DIAGNOSIS — R69 Illness, unspecified: Secondary | ICD-10-CM | POA: Diagnosis not present

## 2022-03-09 DIAGNOSIS — R7303 Prediabetes: Secondary | ICD-10-CM | POA: Diagnosis not present

## 2022-03-09 DIAGNOSIS — Z9089 Acquired absence of other organs: Secondary | ICD-10-CM | POA: Diagnosis not present

## 2022-03-09 DIAGNOSIS — Z7982 Long term (current) use of aspirin: Secondary | ICD-10-CM | POA: Diagnosis not present

## 2022-03-11 DIAGNOSIS — Z471 Aftercare following joint replacement surgery: Secondary | ICD-10-CM | POA: Diagnosis not present

## 2022-03-11 DIAGNOSIS — Z96653 Presence of artificial knee joint, bilateral: Secondary | ICD-10-CM | POA: Diagnosis not present

## 2022-03-11 DIAGNOSIS — Z9181 History of falling: Secondary | ICD-10-CM | POA: Diagnosis not present

## 2022-03-11 DIAGNOSIS — R69 Illness, unspecified: Secondary | ICD-10-CM | POA: Diagnosis not present

## 2022-03-11 DIAGNOSIS — Z90710 Acquired absence of both cervix and uterus: Secondary | ICD-10-CM | POA: Diagnosis not present

## 2022-03-11 DIAGNOSIS — Z8543 Personal history of malignant neoplasm of ovary: Secondary | ICD-10-CM | POA: Diagnosis not present

## 2022-03-11 DIAGNOSIS — Z9089 Acquired absence of other organs: Secondary | ICD-10-CM | POA: Diagnosis not present

## 2022-03-11 DIAGNOSIS — Z7982 Long term (current) use of aspirin: Secondary | ICD-10-CM | POA: Diagnosis not present

## 2022-03-11 DIAGNOSIS — R7303 Prediabetes: Secondary | ICD-10-CM | POA: Diagnosis not present

## 2022-03-11 DIAGNOSIS — E782 Mixed hyperlipidemia: Secondary | ICD-10-CM | POA: Diagnosis not present

## 2022-03-11 DIAGNOSIS — M810 Age-related osteoporosis without current pathological fracture: Secondary | ICD-10-CM | POA: Diagnosis not present

## 2022-03-11 DIAGNOSIS — E559 Vitamin D deficiency, unspecified: Secondary | ICD-10-CM | POA: Diagnosis not present

## 2022-03-11 DIAGNOSIS — G8929 Other chronic pain: Secondary | ICD-10-CM | POA: Diagnosis not present

## 2022-03-13 DIAGNOSIS — R69 Illness, unspecified: Secondary | ICD-10-CM | POA: Diagnosis not present

## 2022-03-13 DIAGNOSIS — E782 Mixed hyperlipidemia: Secondary | ICD-10-CM | POA: Diagnosis not present

## 2022-03-13 DIAGNOSIS — E559 Vitamin D deficiency, unspecified: Secondary | ICD-10-CM | POA: Diagnosis not present

## 2022-03-13 DIAGNOSIS — M810 Age-related osteoporosis without current pathological fracture: Secondary | ICD-10-CM | POA: Diagnosis not present

## 2022-03-13 DIAGNOSIS — Z7982 Long term (current) use of aspirin: Secondary | ICD-10-CM | POA: Diagnosis not present

## 2022-03-13 DIAGNOSIS — G8929 Other chronic pain: Secondary | ICD-10-CM | POA: Diagnosis not present

## 2022-03-13 DIAGNOSIS — R7303 Prediabetes: Secondary | ICD-10-CM | POA: Diagnosis not present

## 2022-03-13 DIAGNOSIS — Z90710 Acquired absence of both cervix and uterus: Secondary | ICD-10-CM | POA: Diagnosis not present

## 2022-03-13 DIAGNOSIS — Z8543 Personal history of malignant neoplasm of ovary: Secondary | ICD-10-CM | POA: Diagnosis not present

## 2022-03-13 DIAGNOSIS — Z9089 Acquired absence of other organs: Secondary | ICD-10-CM | POA: Diagnosis not present

## 2022-03-13 DIAGNOSIS — Z471 Aftercare following joint replacement surgery: Secondary | ICD-10-CM | POA: Diagnosis not present

## 2022-03-13 DIAGNOSIS — Z9181 History of falling: Secondary | ICD-10-CM | POA: Diagnosis not present

## 2022-03-13 DIAGNOSIS — Z96653 Presence of artificial knee joint, bilateral: Secondary | ICD-10-CM | POA: Diagnosis not present

## 2022-03-17 DIAGNOSIS — Z8543 Personal history of malignant neoplasm of ovary: Secondary | ICD-10-CM | POA: Diagnosis not present

## 2022-03-17 DIAGNOSIS — M810 Age-related osteoporosis without current pathological fracture: Secondary | ICD-10-CM | POA: Diagnosis not present

## 2022-03-17 DIAGNOSIS — R7303 Prediabetes: Secondary | ICD-10-CM | POA: Diagnosis not present

## 2022-03-17 DIAGNOSIS — E559 Vitamin D deficiency, unspecified: Secondary | ICD-10-CM | POA: Diagnosis not present

## 2022-03-17 DIAGNOSIS — G8929 Other chronic pain: Secondary | ICD-10-CM | POA: Diagnosis not present

## 2022-03-17 DIAGNOSIS — Z96653 Presence of artificial knee joint, bilateral: Secondary | ICD-10-CM | POA: Diagnosis not present

## 2022-03-17 DIAGNOSIS — Z7982 Long term (current) use of aspirin: Secondary | ICD-10-CM | POA: Diagnosis not present

## 2022-03-17 DIAGNOSIS — Z90710 Acquired absence of both cervix and uterus: Secondary | ICD-10-CM | POA: Diagnosis not present

## 2022-03-17 DIAGNOSIS — Z9181 History of falling: Secondary | ICD-10-CM | POA: Diagnosis not present

## 2022-03-17 DIAGNOSIS — Z9089 Acquired absence of other organs: Secondary | ICD-10-CM | POA: Diagnosis not present

## 2022-03-17 DIAGNOSIS — Z471 Aftercare following joint replacement surgery: Secondary | ICD-10-CM | POA: Diagnosis not present

## 2022-03-17 DIAGNOSIS — R69 Illness, unspecified: Secondary | ICD-10-CM | POA: Diagnosis not present

## 2022-03-17 DIAGNOSIS — E782 Mixed hyperlipidemia: Secondary | ICD-10-CM | POA: Diagnosis not present

## 2022-03-18 ENCOUNTER — Ambulatory Visit (INDEPENDENT_AMBULATORY_CARE_PROVIDER_SITE_OTHER): Payer: Medicare HMO | Admitting: Orthopaedic Surgery

## 2022-03-18 DIAGNOSIS — Z96651 Presence of right artificial knee joint: Secondary | ICD-10-CM

## 2022-03-18 MED ORDER — OXYCODONE HCL 5 MG PO TABS: 5.0000 mg | ORAL_TABLET | Freq: Four times a day (QID) | ORAL | 0 refills | Status: DC | PRN

## 2022-03-18 MED ORDER — GABAPENTIN 300 MG PO CAPS: 300.0000 mg | ORAL_CAPSULE | Freq: Every day | ORAL | 1 refills | Status: DC

## 2022-03-18 NOTE — Addendum Note (Signed)
Addended by: Robyne Peers on: 03/18/2022 04:09 PM   Modules accepted: Orders

## 2022-03-18 NOTE — Progress Notes (Signed)
The patient comes in 2 weeks status post a right total knee arthroplasty.  She does report improving range of motion and strength.  She is ready to transition outpatient physical therapy.  She is able with a cane.  She does report a burning type of pain in her knee and difficulty sleeping at night.  She has been compliant with a baby aspirin twice a day.  On exam her staples have been removed and Steri-Strips applied.  The incision looks good.  Her calf is soft.  There is no significant swelling in the foot and ankle.  She lacks full extension by 3 to 5 degrees and I can only flex her to about 85 degrees.  I stressed the importance of outpatient physical therapy.  This can be set up in [place].  I will continue her oxycodone but I would like to add Neurontin 300 mg at night to help with burning pain and sleep.  She can stop her aspirin.  I would like to see her back in 4 weeks for repeat exam but no x-rays are needed.

## 2022-03-19 DIAGNOSIS — Z90710 Acquired absence of both cervix and uterus: Secondary | ICD-10-CM | POA: Diagnosis not present

## 2022-03-19 DIAGNOSIS — R7303 Prediabetes: Secondary | ICD-10-CM | POA: Diagnosis not present

## 2022-03-19 DIAGNOSIS — Z8543 Personal history of malignant neoplasm of ovary: Secondary | ICD-10-CM | POA: Diagnosis not present

## 2022-03-19 DIAGNOSIS — R69 Illness, unspecified: Secondary | ICD-10-CM | POA: Diagnosis not present

## 2022-03-19 DIAGNOSIS — E782 Mixed hyperlipidemia: Secondary | ICD-10-CM | POA: Diagnosis not present

## 2022-03-19 DIAGNOSIS — Z471 Aftercare following joint replacement surgery: Secondary | ICD-10-CM | POA: Diagnosis not present

## 2022-03-19 DIAGNOSIS — M810 Age-related osteoporosis without current pathological fracture: Secondary | ICD-10-CM | POA: Diagnosis not present

## 2022-03-19 DIAGNOSIS — Z9089 Acquired absence of other organs: Secondary | ICD-10-CM | POA: Diagnosis not present

## 2022-03-19 DIAGNOSIS — Z96653 Presence of artificial knee joint, bilateral: Secondary | ICD-10-CM | POA: Diagnosis not present

## 2022-03-19 DIAGNOSIS — Z7982 Long term (current) use of aspirin: Secondary | ICD-10-CM | POA: Diagnosis not present

## 2022-03-19 DIAGNOSIS — E559 Vitamin D deficiency, unspecified: Secondary | ICD-10-CM | POA: Diagnosis not present

## 2022-03-19 DIAGNOSIS — G8929 Other chronic pain: Secondary | ICD-10-CM | POA: Diagnosis not present

## 2022-03-19 DIAGNOSIS — Z9181 History of falling: Secondary | ICD-10-CM | POA: Diagnosis not present

## 2022-03-20 DIAGNOSIS — R7303 Prediabetes: Secondary | ICD-10-CM | POA: Diagnosis not present

## 2022-03-20 DIAGNOSIS — Z96653 Presence of artificial knee joint, bilateral: Secondary | ICD-10-CM | POA: Diagnosis not present

## 2022-03-20 DIAGNOSIS — G8929 Other chronic pain: Secondary | ICD-10-CM | POA: Diagnosis not present

## 2022-03-20 DIAGNOSIS — E559 Vitamin D deficiency, unspecified: Secondary | ICD-10-CM | POA: Diagnosis not present

## 2022-03-20 DIAGNOSIS — Z9181 History of falling: Secondary | ICD-10-CM | POA: Diagnosis not present

## 2022-03-20 DIAGNOSIS — Z9089 Acquired absence of other organs: Secondary | ICD-10-CM | POA: Diagnosis not present

## 2022-03-20 DIAGNOSIS — Z471 Aftercare following joint replacement surgery: Secondary | ICD-10-CM | POA: Diagnosis not present

## 2022-03-20 DIAGNOSIS — Z7982 Long term (current) use of aspirin: Secondary | ICD-10-CM | POA: Diagnosis not present

## 2022-03-20 DIAGNOSIS — R69 Illness, unspecified: Secondary | ICD-10-CM | POA: Diagnosis not present

## 2022-03-20 DIAGNOSIS — E782 Mixed hyperlipidemia: Secondary | ICD-10-CM | POA: Diagnosis not present

## 2022-03-20 DIAGNOSIS — Z8543 Personal history of malignant neoplasm of ovary: Secondary | ICD-10-CM | POA: Diagnosis not present

## 2022-03-20 DIAGNOSIS — M810 Age-related osteoporosis without current pathological fracture: Secondary | ICD-10-CM | POA: Diagnosis not present

## 2022-03-20 DIAGNOSIS — Z90710 Acquired absence of both cervix and uterus: Secondary | ICD-10-CM | POA: Diagnosis not present

## 2022-03-24 ENCOUNTER — Encounter (HOSPITAL_COMMUNITY): Payer: Self-pay | Admitting: Physical Therapy

## 2022-03-24 ENCOUNTER — Ambulatory Visit (HOSPITAL_COMMUNITY): Payer: Medicare HMO | Attending: Orthopaedic Surgery | Admitting: Physical Therapy

## 2022-03-24 DIAGNOSIS — R29898 Other symptoms and signs involving the musculoskeletal system: Secondary | ICD-10-CM | POA: Diagnosis not present

## 2022-03-24 DIAGNOSIS — Z96651 Presence of right artificial knee joint: Secondary | ICD-10-CM | POA: Insufficient documentation

## 2022-03-24 DIAGNOSIS — M6281 Muscle weakness (generalized): Secondary | ICD-10-CM | POA: Insufficient documentation

## 2022-03-24 DIAGNOSIS — M25561 Pain in right knee: Secondary | ICD-10-CM | POA: Diagnosis not present

## 2022-03-24 DIAGNOSIS — R2689 Other abnormalities of gait and mobility: Secondary | ICD-10-CM | POA: Insufficient documentation

## 2022-03-24 DIAGNOSIS — M25661 Stiffness of right knee, not elsewhere classified: Secondary | ICD-10-CM | POA: Insufficient documentation

## 2022-03-24 NOTE — Therapy (Signed)
OUTPATIENT PHYSICAL THERAPY LOWER EXTREMITY EVALUATION   Patient Name: Evelyn Le MRN: [id number] DOB:1955-12-28, 66 y.o., female Today's Date: 03/24/2022   PT End of Session - 03/24/22 1008     Visit Number 1    Number of Visits 12    Date for PT Re-Evaluation 05/05/22    Authorization Type [employer] Medicare NO VL, no auth, medical necessity)    Progress Note Due on Visit 10    PT Start Time 1009    PT Stop Time 1053    PT Time Calculation (min) 44 min    Activity Tolerance Patient tolerated treatment well    Behavior During Therapy WFL for tasks assessed/performed             Past Medical History:  Diagnosis Date   Anxiety    Cancer ([place])    Depression    PONV (postoperative nausea and vomiting)    Past Surgical History:  Procedure Laterality Date   ABDOMINAL HYSTERECTOMY     APPENDECTOMY     HERNIA REPAIR     INCISIONAL HERNIA REPAIR  2010   REPLACEMENT TOTAL KNEE Left    TONSILLECTOMY     TOTAL KNEE ARTHROPLASTY Right 03/03/2022   Procedure: RIGHT TOTAL KNEE ARTHROPLASTY;  Surgeon: [name], MD;  Location: [facility];  Service: Orthopedics;  Laterality: Right;  RNFA   Patient Active Problem List   Diagnosis Date Noted   Hospital discharge follow-up 03/06/2022   Status post right knee replacement 03/03/2022   Unilateral primary osteoarthritis, right knee 01/08/2022   Mixed hyperlipidemia 10/09/2021   Vitamin D deficiency 10/09/2021   Prediabetes 10/09/2021   Age-related osteoporosis without current pathological fracture 03/21/2021   Anxiety 11/08/2020   History of ovarian cancer 11/08/2020   History of total knee arthroplasty, left 11/08/2020   Postoperative incisional hernia 11/08/2020    PCP: Ihor Dow MD  REFERRING PROVIDER: [name], MD   REFERRING DIAG: [id number] (ICD-10-CM) - Status post right knee replacement   THERAPY DIAG:  Right knee pain, unspecified chronicity  Muscle weakness (generalized)  Other  abnormalities of gait and mobility  Other symptoms and signs involving the musculoskeletal system  Stiffness of right knee, not elsewhere classified  Rationale for Evaluation and Treatment Rehabilitation  ONSET DATE: 03/03/22  SUBJECTIVE:   SUBJECTIVE STATEMENT: Patient s/p R TKA on 03/03/22. Patient states she's been doing betty good. Patient has been using SPC and had HHPT. Doing exercises 2x/day followed by a little walk and ice. PT measured her knee to 95 last week.   PERTINENT HISTORY: Hx L TKA, HLD, osteoporosis, anxiety, hx ovarian cancer  PAIN:  Are you having pain? No  PRECAUTIONS: None  WEIGHT BEARING RESTRICTIONS No  FALLS:  Has patient fallen in last 6 months? Yes. Number of falls 3 before surgery  LIVING ENVIRONMENT: Lives with: lives with their spouse Lives in: House/apartment Stairs: Yes: External: 3 steps; on left going up Has following equipment at home: Single point cane and Walker - 2 wheeled  OCCUPATION: Retired  PLOF: Independent  PATIENT GOALS to go up and down steps like normal, play with grandkids   OBJECTIVE:  Observation: healing surgical incision with steri strips intact, appears to be healing well  PATIENT SURVEYS:  FOTO 55% function  COGNITION:  Overall cognitive status: Within functional limits for tasks assessed     SENSATION: WFL  EDEMA:  Circumferential: R 43 cm    POSTURE: No Significant postural limitations  PALPATION: Slight TTP in R quad  LOWER EXTREMITY ROM:  Active ROM Right eval Left eval  Hip flexion    Hip extension    Hip abduction    Hip adduction    Hip internal rotation    Hip external rotation    Knee flexion 88 128  Knee extension Lacking 27 0  Ankle dorsiflexion    Ankle plantarflexion    Ankle inversion    Ankle eversion     (Blank rows = not tested)  LOWER EXTREMITY MMT:  MMT Right eval Left eval  Hip flexion 5/5 5/5  Hip extension    Hip abduction    Hip adduction    Hip  internal rotation    Hip external rotation    Knee flexion 5/5 5/5  Knee extension 4+/5 5/5  Ankle dorsiflexion 5/5 5/5  Ankle plantarflexion    Ankle inversion    Ankle eversion     (Blank rows = not tested)   FUNCTIONAL TESTS:  5 times sit to stand: 14.22 seconds with heavy UE support and reliance on LLE  GAIT: Distance walked: 100 Assistive device utilized: Single point cane Level of assistance: Modified independence Comments: antalgic on RLE lacking TKE with limited knee flexion    TODAY'S TREATMENT: 03/24/22 Heel slide 10x 5-10 second holds with belt Quad set 10 x 10 second holds   PATIENT EDUCATION:  Education details: Patient educated on exam findings, POC, scope of PT, HEP, elevation for edema, not overdoing activity, and LE positioning at rest. Person educated: Patient Education method: Consulting civil engineer, Demonstration, and Handouts Education comprehension: verbalized understanding, returned demonstration, verbal cues required, and tactile cues required  HOME EXERCISE PROGRAM: 6/6/23Access Code: [email or web address] - Supine Heel Slide with Strap (Mirrored)  - 3-5 x daily - 7 x weekly - 10 reps - 5-10 second hold - Supine Knee Extension Stretch on Towel Roll (Mirrored)  - 3 x daily - 7 x weekly - 15-20 minutes hold  ASSESSMENT:  CLINICAL IMPRESSION: Patient a 66 y.o. y.o. female who was seen today for physical therapy evaluation and treatment s/p R TKA on 03/03/22. Patient presents with pain limited deficits in R knee strength, ROM, endurance, activity tolerance, gait, balance, edema, and functional mobility with ADL. Patient is having to modify and restrict ADL as indicated by outcome measure score as well as subjective information and objective measures which is affecting overall participation. Patient will benefit from skilled physical therapy in order to improve function and reduce impairment.   OBJECTIVE IMPAIRMENTS Abnormal gait, decreased activity tolerance, decreased balance,  decreased endurance, decreased mobility, difficulty walking, decreased ROM, decreased strength, increased edema, impaired flexibility, improper body mechanics, and pain.   ACTIVITY LIMITATIONS lifting, bending, standing, squatting, sleeping, stairs, transfers, bed mobility, bathing, dressing, locomotion level, and caring for others  PARTICIPATION LIMITATIONS: meal prep, cleaning, laundry, driving, shopping, community activity, and yard work  PERSONAL FACTORS Time since onset of injury/illness/exacerbation and 3+ comorbidities: Anxiety, BMI increased, Hx Cancer, Osteoporosis  are also affecting patient's functional outcome.   REHAB POTENTIAL: Good  CLINICAL DECISION MAKING: Stable/uncomplicated  EVALUATION COMPLEXITY: Low   GOALS: Goals reviewed with patient? Yes  SHORT TERM GOALS: Target date: 04/14/2022  Patient will be independent with HEP in order to improve functional outcomes. Baseline:  Goal status: INITIAL  2.  Patient will report at least 25% improvement in symptoms for improved quality of life. Baseline:  Goal status: INITIAL    LONG TERM GOALS: Target date: 05/05/2022  Patient will report at least 75% improvement in symptoms for improved quality  of life. Baseline:  Goal status: INITIAL  2.  Patient will improve FOTO score by at least 25 points in order to indicate improved tolerance to activity. Baseline: 55% function Goal status: INITIAL  3.  Patient will be able to navigate stairs with reciprocal pattern without compensation in order to demonstrate improved LE strength. Baseline: step too pattern reported Goal status: INITIAL  4.  Patient will be able to complete 5x STS in under 11.4 seconds with equal weightbearing in order to demonstrate improved functional strength. Baseline: 14.22 seconds with heavy UE support and reliance on LLE Goal status: INITIAL  5.  Patient will improve ROM for R knee extension/flexion to lacking 5-120 degrees to improve squatting,  and other functional mobility. Baseline: lacking 27 to 88 Goal status: INITIAL     PLAN: PT FREQUENCY: 2x/week  PT DURATION: 6 weeks  PLANNED INTERVENTIONS: Therapeutic exercises, Therapeutic activity, Neuromuscular re-education, Balance training, Gait training, Patient/Family education, Joint manipulation, Joint mobilization, Stair training, Orthotic/Fit training, DME instructions, Aquatic Therapy, Dry Needling, Electrical stimulation, Spinal manipulation, Spinal mobilization, Cryotherapy, Moist heat, Compression bandaging, scar mobilization, Splintting, Taping, Traction, Ultrasound, Ionotophoresis '4mg'$ /ml Dexamethasone, and Manual therapy   PLAN FOR NEXT SESSION: f/u with HEP, promote knee extension/flexion ROM, edema management PRN, strengthening as tolerated   Vianne Bulls Jolonda Gomm, PT 03/24/2022, 10:55 AM

## 2022-03-25 ENCOUNTER — Ambulatory Visit (HOSPITAL_COMMUNITY): Payer: Medicare HMO | Attending: Orthopaedic Surgery | Admitting: Physical Therapy

## 2022-03-25 ENCOUNTER — Encounter (HOSPITAL_COMMUNITY): Payer: Self-pay | Admitting: Physical Therapy

## 2022-03-25 DIAGNOSIS — M6281 Muscle weakness (generalized): Secondary | ICD-10-CM | POA: Insufficient documentation

## 2022-03-25 DIAGNOSIS — M25661 Stiffness of right knee, not elsewhere classified: Secondary | ICD-10-CM | POA: Diagnosis not present

## 2022-03-25 DIAGNOSIS — R2689 Other abnormalities of gait and mobility: Secondary | ICD-10-CM | POA: Diagnosis not present

## 2022-03-25 DIAGNOSIS — R29898 Other symptoms and signs involving the musculoskeletal system: Secondary | ICD-10-CM | POA: Diagnosis not present

## 2022-03-25 DIAGNOSIS — M25561 Pain in right knee: Secondary | ICD-10-CM | POA: Insufficient documentation

## 2022-03-25 NOTE — Therapy (Signed)
OUTPATIENT PHYSICAL THERAPY TREATMENT   Patient Name: Evelyn Le MRN: [id number] DOB:1956/07/02, 66 y.o., female Today's Date: 03/25/2022   PT End of Session - 03/25/22 1013     Visit Number 2    Number of Visits 12    Date for PT Re-Evaluation 05/05/22    Authorization Type [employer] Medicare NO VL, no auth, medical necessity)    Progress Note Due on Visit 10    PT Start Time 1010    PT Stop Time 1050    PT Time Calculation (min) 40 min    Activity Tolerance Patient tolerated treatment well    Behavior During Therapy WFL for tasks assessed/performed             Past Medical History:  Diagnosis Date   Anxiety    Cancer ([place])    Depression    PONV (postoperative nausea and vomiting)    Past Surgical History:  Procedure Laterality Date   ABDOMINAL HYSTERECTOMY     APPENDECTOMY     HERNIA REPAIR     INCISIONAL HERNIA REPAIR  2010   REPLACEMENT TOTAL KNEE Left    TONSILLECTOMY     TOTAL KNEE ARTHROPLASTY Right 03/03/2022   Procedure: RIGHT TOTAL KNEE ARTHROPLASTY;  Surgeon: [name], MD;  Location: [facility];  Service: Orthopedics;  Laterality: Right;  RNFA   Patient Active Problem List   Diagnosis Date Noted   Hospital discharge follow-up 03/06/2022   Status post right knee replacement 03/03/2022   Unilateral primary osteoarthritis, right knee 01/08/2022   Mixed hyperlipidemia 10/09/2021   Vitamin D deficiency 10/09/2021   Prediabetes 10/09/2021   Age-related osteoporosis without current pathological fracture 03/21/2021   Anxiety 11/08/2020   History of ovarian cancer 11/08/2020   History of total knee arthroplasty, left 11/08/2020   Postoperative incisional hernia 11/08/2020    PCP: Ihor Dow MD  REFERRING PROVIDER: [name], MD   REFERRING DIAG: [phone] (ICD-10-CM) - Status post right knee replacement   THERAPY DIAG:  Right knee pain, unspecified chronicity  Muscle weakness (generalized)  Other abnormalities of gait  and mobility  Other symptoms and signs involving the musculoskeletal system  Stiffness of right knee, not elsewhere classified  Rationale for Evaluation and Treatment Rehabilitation  ONSET DATE: 03/03/22  SUBJECTIVE:   SUBJECTIVE STATEMENT: Patient she has been doing heel prop and exercises at home. Less swelling with heel prop.  PERTINENT HISTORY: Hx L TKA, HLD, osteoporosis, anxiety, hx ovarian cancer  PAIN:  Are you having pain? No and Yes: NPRS scale: 1/10 Pain location: knee Pain description: Sore Aggravating factors:   Relieving factors:    PRECAUTIONS: None  WEIGHT BEARING RESTRICTIONS No  FALLS:  Has patient fallen in last 6 months? Yes. Number of falls 3 before surgery  LIVING ENVIRONMENT: Lives with: lives with their spouse Lives in: House/apartment Stairs: Yes: External: 3 steps; on left going up Has following equipment at home: Single point cane and Walker - 2 wheeled  OCCUPATION: Retired  PLOF: [place] to go up and down steps like normal, play with grandkids   OBJECTIVE:  Observation: healing surgical incision with steri strips intact, appears to be healing well  PATIENT SURVEYS:  FOTO 55% function  COGNITION:  Overall cognitive status: Within functional limits for tasks assessed     SENSATION: WFL  EDEMA:  Circumferential: R 43 cm    POSTURE: No Significant postural limitations  PALPATION: Slight TTP in R quad  LOWER EXTREMITY ROM:  Active ROM  Right eval Left eval Right 04/14/22   Hip flexion      Hip extension      Hip abduction      Hip adduction      Hip internal rotation      Hip external rotation      Knee flexion 88 128 94   Knee extension Lacking 27 0 Lacking 12   Ankle dorsiflexion      Ankle plantarflexion      Ankle inversion      Ankle eversion       (Blank rows = not tested)  LOWER EXTREMITY MMT:  MMT Right eval Left eval  Hip flexion 5/5 5/5  Hip extension    Hip abduction    Hip  adduction    Hip internal rotation    Hip external rotation    Knee flexion 5/5 5/5  Knee extension 4+/5 5/5  Ankle dorsiflexion 5/5 5/5  Ankle plantarflexion    Ankle inversion    Ankle eversion     (Blank rows = not tested)   FUNCTIONAL TESTS:  5 times sit to stand: 14.22 seconds with heavy UE support and reliance on LLE  GAIT: Distance walked: 100 Assistive device utilized: Single point cane Level of assistance: Modified independence Comments: antalgic on RLE lacking TKE with limited knee flexion    TODAY'S TREATMENT: 03/25/22 Heel slide 10x 5-10 second holds with belt Quad set 10 x 10 second holds SLR R 2x 10  Supine hamstring isometric into green ball 90/90 position 10 x 10 second holds RLE Recumbent bike - rocking for ROM with 5 second holds at end range flexion HR 1x 15 TR 1x 15 Standing hip abduction 2x 10 bilateral  Mini squat 1x 10     03/24/22 Heel slide 10x 5-10 second holds with belt Quad set 10 x 10 second holds   PATIENT EDUCATION:  Education details: Patient educated on exam findings, POC, scope of PT, HEP, elevation for edema, not overdoing activity, and LE positioning at rest. 03/25/22 HEP, walking but not overdoing activity Person educated: Patient Education method: Explanation, Demonstration, and Handouts Education comprehension: verbalized understanding, returned demonstration, verbal cues required, and tactile cues required  HOME EXERCISE PROGRAM: 6/6/23Access Code: [email or web address] - Supine Heel Slide with Strap (Mirrored)  - 3-5 x daily - 7 x weekly - 10 reps - 5-10 second hold - Supine Knee Extension Stretch on Towel Roll (Mirrored)  - 3 x daily - 7 x weekly - 15-20 minutes hold 03/25/22 - Long Sitting Quad Set (Mirrored)  - 3 x daily - 7 x weekly - 10 reps - 10 second hold - Active Straight Leg Raise with Quad Set  - 3 x daily - 7 x weekly - 2 sets - 10 reps - Standing Heel Raises  - 3 x daily - 7 x weekly - 15 reps - Toe Raises with Counter Support   - 3 x daily - 7 x weekly - 1 sets - 15 reps - Standing Hip Abduction with Counter Support (Mirrored)  - 3 x daily - 7 x weekly - 2 sets - 10 reps - Mini Squat with Counter Support  - 3 x daily - 7 x weekly - 2 sets - 10 reps  ASSESSMENT:  CLINICAL IMPRESSION: Patient demonstrating improving ROM today with less edema present. Continued with knee mobility and quad strengthening which are tolerated well. Began bike rocking for flexion ROM with holds at end range flexion. Performed some home health exercises she was  performing and tried to progress as able with standing strengthening. Patient will continue to benefit from physical therapy in order to improve function and reduce impairment.    OBJECTIVE IMPAIRMENTS Abnormal gait, decreased activity tolerance, decreased balance, decreased endurance, decreased mobility, difficulty walking, decreased ROM, decreased strength, increased edema, impaired flexibility, improper body mechanics, and pain.   ACTIVITY LIMITATIONS lifting, bending, standing, squatting, sleeping, stairs, transfers, bed mobility, bathing, dressing, locomotion level, and caring for others  PARTICIPATION LIMITATIONS: meal prep, cleaning, laundry, driving, shopping, community activity, and yard work  PERSONAL FACTORS Time since onset of injury/illness/exacerbation and 3+ comorbidities: Anxiety, BMI increased, Hx Cancer, Osteoporosis  are also affecting patient's functional outcome.   REHAB POTENTIAL: Good  CLINICAL DECISION MAKING: Stable/uncomplicated  EVALUATION COMPLEXITY: Low   GOALS: Goals reviewed with patient? Yes  SHORT TERM GOALS: Target date: 04/14/2022  Patient will be independent with HEP in order to improve functional outcomes. Baseline:  Goal status: IN PROGRESS  2.  Patient will report at least 25% improvement in symptoms for improved quality of life. Baseline:  Goal status: IN PROGRESS    LONG TERM GOALS: Target date: 05/05/2022  Patient will report  at least 75% improvement in symptoms for improved quality of life. Baseline:  Goal status: IN PROGRESS  2.  Patient will improve FOTO score by at least 25 points in order to indicate improved tolerance to activity. Baseline: 55% function Goal status: IN PROGRESS  3.  Patient will be able to navigate stairs with reciprocal pattern without compensation in order to demonstrate improved LE strength. Baseline: step too pattern reported Goal status: IN PROGRESS  4.  Patient will be able to complete 5x STS in under 11.4 seconds with equal weightbearing in order to demonstrate improved functional strength. Baseline: 14.22 seconds with heavy UE support and reliance on LLE Goal status: IN PROGRESS  5.  Patient will improve ROM for R knee extension/flexion to lacking 5-120 degrees to improve squatting, and other functional mobility. Baseline: lacking 27 to 88 Goal status: IN PROGRESS     PLAN: PT FREQUENCY: 2x/week  PT DURATION: 6 weeks  PLANNED INTERVENTIONS: Therapeutic exercises, Therapeutic activity, Neuromuscular re-education, Balance training, Gait training, Patient/Family education, Joint manipulation, Joint mobilization, Stair training, Orthotic/Fit training, DME instructions, Aquatic Therapy, Dry Needling, Electrical stimulation, Spinal manipulation, Spinal mobilization, Cryotherapy, Moist heat, Compression bandaging, scar mobilization, Splintting, Taping, Traction, Ultrasound, Ionotophoresis '4mg'$ /ml Dexamethasone, and Manual therapy   PLAN FOR NEXT SESSION: f/u with HEP, promote knee extension/flexion ROM, edema management PRN, strengthening as tolerated   Vianne Bulls Yazmeen Woolf, PT 03/25/2022, 10:24 AM

## 2022-03-27 ENCOUNTER — Ambulatory Visit (HOSPITAL_COMMUNITY): Payer: Medicare HMO

## 2022-04-01 ENCOUNTER — Ambulatory Visit (HOSPITAL_COMMUNITY): Payer: Medicare HMO | Attending: Pediatrics | Admitting: Physical Therapy

## 2022-04-01 DIAGNOSIS — M25661 Stiffness of right knee, not elsewhere classified: Secondary | ICD-10-CM

## 2022-04-01 DIAGNOSIS — M25561 Pain in right knee: Secondary | ICD-10-CM

## 2022-04-01 DIAGNOSIS — R29898 Other symptoms and signs involving the musculoskeletal system: Secondary | ICD-10-CM

## 2022-04-01 DIAGNOSIS — R2689 Other abnormalities of gait and mobility: Secondary | ICD-10-CM

## 2022-04-01 DIAGNOSIS — Z96651 Presence of right artificial knee joint: Secondary | ICD-10-CM | POA: Diagnosis not present

## 2022-04-01 DIAGNOSIS — M6281 Muscle weakness (generalized): Secondary | ICD-10-CM

## 2022-04-01 NOTE — Therapy (Addendum)
OUTPATIENT PHYSICAL THERAPY TREATMENT   Patient Name: Evelyn Le MRN: [id number] DOB:[birth year], 66 y.o., female Today's Date: 04/01/2022   PT End of Session - 04/01/22 0917     Visit Number 3    Number of Visits 12    Date for PT Re-Evaluation 05/05/22    Authorization Type [employer] NO VL, no auth, medical necessity)    Progress Note Due on Visit 10    PT Start Time 0918    PT Stop Time 1000    PT Time Calculation (min) 42 min    Activity Tolerance Patient tolerated treatment well    Behavior During Therapy WFL for tasks assessed/performed             Past Medical History:  Diagnosis Date   Anxiety    Cancer ([place])    Depression    PONV (postoperative nausea and vomiting)    Past Surgical History:  Procedure Laterality Date   ABDOMINAL HYSTERECTOMY     APPENDECTOMY     HERNIA REPAIR     INCISIONAL HERNIA REPAIR  2010   REPLACEMENT TOTAL KNEE Left    TONSILLECTOMY     TOTAL KNEE ARTHROPLASTY Right 03/03/2022   Procedure: RIGHT TOTAL KNEE ARTHROPLASTY;  Surgeon: [name], MD;  Location: [facility];  Service: Orthopedics;  Laterality: Right;  RNFA   Patient Active Problem List   Diagnosis Date Noted   Hospital discharge follow-up 03/06/2022   Status post right knee replacement 03/03/2022   Unilateral primary osteoarthritis, right knee 01/08/2022   Mixed hyperlipidemia 10/09/2021   Vitamin D deficiency 10/09/2021   Prediabetes 10/09/2021   Age-related osteoporosis without current pathological fracture 03/21/2021   Anxiety 11/08/2020   History of ovarian cancer 11/08/2020   History of total knee arthroplasty, left 11/08/2020   Postoperative incisional hernia 11/08/2020    PCP: Ihor Dow MD  REFERRING PROVIDER: [name], MD   REFERRING DIAG: [phone] (ICD-10-CM) - Status post right knee replacement   THERAPY DIAG:  Right knee pain, unspecified chronicity  Muscle weakness (generalized)  Other abnormalities of gait  and mobility  Stiffness of right knee, not elsewhere classified  Other symptoms and signs involving the musculoskeletal system  Rationale for Evaluation and Treatment Rehabilitation  ONSET DATE: 03/03/22  SUBJECTIVE:   SUBJECTIVE STATEMENT:  Pt states that despite icing her knee feels stiff.   PERTINENT HISTORY: Hx L TKA, HLD, osteoporosis, anxiety, hx ovarian cancer  PAIN:  Are you having pain? no  PRECAUTIONS: None   PATIENT GOALS to go up and down steps like normal, play with grandkids   OBJECTIVE:  Observation: healing surgical incision with steri strips intact, appears to be healing well  PATIENT SURVEYS:  FOTO 55% function  EDEMA:  Circumferential: R 43 cm    POSTURE: No Significant postural limitations  PALPATION: Slight TTP in R quad  LOWER EXTREMITY ROM:  Active ROM Right eval Left eval Right 03/25/22 Right  04/01/2022  Hip flexion      Hip extension      Hip abduction      Hip adduction      Hip internal rotation      Hip external rotation      Knee flexion 88 128 94 101  Knee extension Lacking 27 0 Lacking 12 Lacking 14  Ankle dorsiflexion      Ankle plantarflexion      Ankle inversion      Ankle eversion       (Blank rows =  not tested)  LOWER EXTREMITY MMT:  MMT Right eval Left eval  Hip flexion 5/5 5/5  Hip extension    Hip abduction    Hip adduction    Hip internal rotation    Hip external rotation    Knee flexion 5/5 5/5  Knee extension 4+/5 5/5  Ankle dorsiflexion 5/5 5/5  Ankle plantarflexion    Ankle inversion    Ankle eversion     (Blank rows = not tested)   FUNCTIONAL TESTS:  5 times sit to stand: 14.22 seconds with heavy UE support and reliance on LLE  GAIT: Distance walked: 100 Assistive device utilized: Single point cane Level of assistance: Modified independence Comments: antalgic on RLE lacking TKE with limited knee flexion    TODAY'S TREATMENT:             04/01/22               Bike x 5' full  revolution seat at 15              Standing:              Heel raises x 10              Passive hamstring stretch on 12" box 3 x 30"              Knee flexion Rt knee on 12" box and lunge 3 x 30"              Terminal extension x 10              Standing knee flexion Rt x 10               Rocker board x 2'              Slant board x 3 30" hold               Supine:  quad set x 10                           SAQ x 10                           Heel slides x 10                            Active hamstring stretch 3 x 30" with active pull down  03/25/22 Heel slide 10x 5-10 second holds with belt Quad set 10 x 10 second holds SLR R 2x 10  Supine hamstring isometric into green ball 90/90 position 10 x 10 second holds RLE Recumbent bike - rocking for ROM with 5 second holds at end range flexion HR 1x 15 TR 1x 15 Standing hip abduction 2x 10 bilateral  Mini squat 1x 10     03/24/22 Heel slide 10x 5-10 second holds with belt Quad set 10 x 10 second holds   PATIENT EDUCATION:  Education details: Patient educated on exam findings, POC, scope of PT, HEP, elevation for edema, not overdoing activity, and LE positioning at rest. 03/25/22 HEP, walking but not overdoing activity Person educated: Patient Education method: Explanation, Demonstration, and Handouts Education comprehension: verbalized understanding, returned demonstration, verbal cues required, and tactile cues required  HOME EXERCISE PROGRAM:              04/02/2011:  [id number] heel  raise, active ham stretch and SAQ 6/6/23Access Code: [email or web address] - Supine Heel Slide with Strap (Mirrored)  - 3-5 x daily - 7 x weekly - 10 reps - 5-10 second hold - Supine Knee Extension Stretch on Towel Roll (Mirrored)  - 3 x daily - 7 x weekly - 15-20 minutes hold 03/25/22 - Long Sitting Quad Set (Mirrored)  - 3 x daily - 7 x weekly - 10 reps - 10 second hold - Active Straight Leg Raise with Quad Set  - 3 x daily - 7 x weekly - 2 sets - 10 reps - Standing Heel  Raises  - 3 x daily - 7 x weekly - 15 reps - Toe Raises with Counter Support  - 3 x daily - 7 x weekly - 1 sets - 15 reps - Standing Hip Abduction with Counter Support (Mirrored)  - 3 x daily - 7 x weekly - 2 sets - 10 reps - Mini Squat with Counter Support  - 3 x daily - 7 x weekly - 2 sets - 10 reps  ASSESSMENT:  CLINICAL IMPRESSION: Treatment today continues to focus on ROM.  PT ROM continues to improve but is still significantly limited causing limitations in functioning.  PT will need continued skilled therapy to improve ROM for improved function.   OBJECTIVE IMPAIRMENTS Abnormal gait, decreased activity tolerance, decreased balance, decreased endurance, decreased mobility, difficulty walking, decreased ROM, decreased strength, increased edema, impaired flexibility, improper body mechanics, and pain.   ACTIVITY LIMITATIONS lifting, bending, standing, squatting, sleeping, stairs, transfers, bed mobility, bathing, dressing, locomotion level, and caring for others  PARTICIPATION LIMITATIONS: meal prep, cleaning, laundry, driving, shopping, community activity, and yard work  PERSONAL FACTORS Time since onset of injury/illness/exacerbation and 3+ comorbidities: Anxiety, BMI increased, Hx Cancer, Osteoporosis  are also affecting patient's functional outcome.   REHAB POTENTIAL: Good  CLINICAL DECISION MAKING: Stable/uncomplicated  EVALUATION COMPLEXITY: Low   GOALS: Goals reviewed with patient? Yes  SHORT TERM GOALS: Target date: 04/14/2022  Patient will be independent with HEP in order to improve functional outcomes. Baseline:  Goal status: IN PROGRESS  2.  Patient will report at least 25% improvement in symptoms for improved quality of life. Baseline:  Goal status: IN PROGRESS    LONG TERM GOALS: Target date: 05/05/2022  Patient will report at least 75% improvement in symptoms for improved quality of life. Baseline:  Goal status: IN PROGRESS  2.  Patient will improve FOTO  score by at least 25 points in order to indicate improved tolerance to activity. Baseline: 55% function Goal status: IN PROGRESS  3.  Patient will be able to navigate stairs with reciprocal pattern without compensation in order to demonstrate improved LE strength. Baseline: step too pattern reported Goal status: IN PROGRESS  4.  Patient will be able to complete 5x STS in under 11.4 seconds with equal weightbearing in order to demonstrate improved functional strength. Baseline: 14.22 seconds with heavy UE support and reliance on LLE Goal status: IN PROGRESS  5.  Patient will improve ROM for R knee extension/flexion to lacking 5-120 degrees to improve squatting, and other functional mobility. Baseline: lacking 27 to 88 Goal status: IN PROGRESS     PLAN: PT FREQUENCY: 2x/week  PT DURATION: 6 weeks  PLANNED INTERVENTIONS: Therapeutic exercises, Therapeutic activity, Neuromuscular re-education, Balance training, Gait training, Patient/Family education, Joint manipulation, Joint mobilization, Stair training, Orthotic/Fit training, DME instructions, Aquatic Therapy, Dry Needling, Electrical stimulation, Spinal manipulation, Spinal mobilization, Cryotherapy, Moist heat, Compression bandaging, scar mobilization, Splintting, Taping,  Traction, Ultrasound, Ionotophoresis '4mg'$ /ml Dexamethasone, and Manual therapy   PLAN FOR NEXT SESSION: f/u with HEP, promote knee extension/flexion ROM, edema management PRN, strengthening as tolerated  Rayetta Humphrey, PT CLT [phone]  04/01/2022, 10:18 AM

## 2022-04-03 ENCOUNTER — Ambulatory Visit (HOSPITAL_COMMUNITY): Payer: Medicare HMO | Attending: Orthopaedic Surgery | Admitting: Physical Therapy

## 2022-04-03 DIAGNOSIS — M25661 Stiffness of right knee, not elsewhere classified: Secondary | ICD-10-CM

## 2022-04-03 DIAGNOSIS — M6281 Muscle weakness (generalized): Secondary | ICD-10-CM

## 2022-04-03 DIAGNOSIS — Z96651 Presence of right artificial knee joint: Secondary | ICD-10-CM | POA: Diagnosis not present

## 2022-04-03 DIAGNOSIS — M25561 Pain in right knee: Secondary | ICD-10-CM | POA: Insufficient documentation

## 2022-04-03 DIAGNOSIS — R2689 Other abnormalities of gait and mobility: Secondary | ICD-10-CM

## 2022-04-03 NOTE — Therapy (Signed)
OUTPATIENT PHYSICAL THERAPY TREATMENT   Patient Name: Evelyn Le MRN: [id number] DOB:02/09/1956, 66 y.o., female Today's Date: 04/03/2022   PT End of Session - 04/03/22 1041     Visit Number 4    Number of Visits 12    Date for PT Re-Evaluation 05/05/22    Authorization Type [employer] Medicare NO VL, no auth, medical necessity)    Progress Note Due on Visit 10    PT Start Time 1045    PT Stop Time 1130    PT Time Calculation (min) 45 min    Activity Tolerance Patient tolerated treatment well    Behavior During Therapy WFL for tasks assessed/performed             Past Medical History:  Diagnosis Date   Anxiety    Cancer ([place])    Depression    PONV (postoperative nausea and vomiting)    Past Surgical History:  Procedure Laterality Date   ABDOMINAL HYSTERECTOMY     APPENDECTOMY     HERNIA REPAIR     INCISIONAL HERNIA REPAIR  2010   REPLACEMENT TOTAL KNEE Left    TONSILLECTOMY     TOTAL KNEE ARTHROPLASTY Right 03/03/2022   Procedure: RIGHT TOTAL KNEE ARTHROPLASTY;  Surgeon: [name], MD;  Location: [facility];  Service: Orthopedics;  Laterality: Right;  RNFA   Patient Active Problem List   Diagnosis Date Noted   Hospital discharge follow-up 03/06/2022   Status post right knee replacement 03/03/2022   Unilateral primary osteoarthritis, right knee 01/08/2022   Mixed hyperlipidemia 10/09/2021   Vitamin D deficiency 10/09/2021   Prediabetes 10/09/2021   Age-related osteoporosis without current pathological fracture 03/21/2021   Anxiety 11/08/2020   History of ovarian cancer 11/08/2020   History of total knee arthroplasty, left 11/08/2020   Postoperative incisional hernia 11/08/2020    PCP: Ihor Dow MD  REFERRING PROVIDER: [name], MD   REFERRING DIAG: [phone] (ICD-10-CM) - Status post right knee replacement   THERAPY DIAG:  Right knee pain, unspecified chronicity  Muscle weakness (generalized)  Other abnormalities of gait  and mobility  Stiffness of right knee, not elsewhere classified  Rationale for Evaluation and Treatment Rehabilitation  ONSET DATE: 03/03/22  SUBJECTIVE:   SUBJECTIVE STATEMENT:  Pt states that she is completing her exercises and she is icing.  Her knee remains stiff but the pain is not bad. She was sore but felt good about last treatment. PERTINENT HISTORY: Hx L TKA, HLD, osteoporosis, anxiety, hx ovarian cancer  PAIN:  Are you having pain? no  PRECAUTIONS: None   PATIENT GOALS to go up and down steps like normal, play with grandkids   OBJECTIVE:  Observation: healing surgical incision with steri strips intact, appears to be healing well  PATIENT SURVEYS:  FOTO 55% function  EDEMA:  Circumferential: R 43 cm    POSTURE: No Significant postural limitations  PALPATION: Slight TTP in R quad  LOWER EXTREMITY ROM:  Active ROM Right eval Left eval Right 03/25/22 Right  04/01/2022 Right  04/03/2022  Hip flexion       Hip extension       Hip abduction       Hip adduction       Hip internal rotation       Hip external rotation       Knee flexion 88 128 94 101 103  Knee extension Lacking 27 0 Lacking 12 Lacking 14 Lacking 10  Ankle dorsiflexion  Ankle plantarflexion       Ankle inversion       Ankle eversion        (Blank rows = not tested)  LOWER EXTREMITY MMT:  MMT Right eval Left eval  Hip flexion 5/5 5/5  Hip extension    Hip abduction    Hip adduction    Hip internal rotation    Hip external rotation    Knee flexion 5/5 5/5  Knee extension 4+/5 5/5  Ankle dorsiflexion 5/5 5/5  Ankle plantarflexion    Ankle inversion    Ankle eversion     (Blank rows = not tested)   FUNCTIONAL TESTS:  5 times sit to stand: 14.22 seconds with heavy UE support and reliance on LLE  GAIT: Distance walked: 100 Assistive device utilized: Single point cane Level of assistance: Modified independence Comments: antalgic on RLE lacking TKE with limited knee  flexion    TODAY'S TREATMENT:              04/03/22               Bike x 5' full revolution seat at 14              Standing:              Heel raises x 15              Toe raise x 10              Passive hamstring stretch on 12" box 3 x 30"              Knee flexion Rt knee on 12" box and lunge 3 x 30"              Terminal extension x 10              Standing knee flexion Rt x 10               Rocker board x 2'              Slant board x 3 30" hold               Single leg stance x 3               Supine:  quad set x 10                           SAQ x 10                           Patella mob                            PROM for extension x 3                            Heel slides x 10                            Active hamstring stretch 3 x 30" with active pull down              04/01/22               Bike x 5' full revolution seat at 77  Standing:              Heel raises x 10              Passive hamstring stretch on 12" box 3 x 30"              Knee flexion Rt knee on 12" box and lunge 3 x 30"              Terminal extension x 10              Standing knee flexion Rt x 10               Rocker board x 2'              Slant board x 3 30" hold               Supine:  quad set x 10                           SAQ x 10                           Heel slides x 10                            Active hamstring stretch 3 x 30" with active pull down  03/25/22 Heel slide 10x 5-10 second holds with belt Quad set 10 x 10 second holds SLR R 2x 10  Supine hamstring isometric into green ball 90/90 position 10 x 10 second holds RLE Recumbent bike - rocking for ROM with 5 second holds at end range flexion HR 1x 15 TR 1x 15 Standing hip abduction 2x 10 bilateral  Mini squat 1x 10     03/24/22 Heel slide 10x 5-10 second holds with belt Quad set 10 x 10 second holds   PATIENT EDUCATION:  Education details: Patient educated on exam findings, POC, scope of PT, HEP, elevation for  edema, not overdoing activity, and LE positioning at rest. 03/25/22 HEP, walking but not overdoing activity Person educated: Patient Education method: Explanation, Demonstration, and Handouts Education comprehension: verbalized understanding, returned demonstration, verbal cues required, and tactile cues required  HOME EXERCISE PROGRAM:              04/02/2011:  [id number] heel raise, active ham stretch and SAQ 6/6/23Access Code: [email or web address] - Supine Heel Slide with Strap (Mirrored)  - 3-5 x daily - 7 x weekly - 10 reps - 5-10 second hold - Supine Knee Extension Stretch on Towel Roll (Mirrored)  - 3 x daily - 7 x weekly - 15-20 minutes hold 03/25/22 - Long Sitting Quad Set (Mirrored)  - 3 x daily - 7 x weekly - 10 reps - 10 second hold - Active Straight Leg Raise with Quad Set  - 3 x daily - 7 x weekly - 2 sets - 10 reps - Standing Heel Raises  - 3 x daily - 7 x weekly - 15 reps - Toe Raises with Counter Support  - 3 x daily - 7 x weekly - 1 sets - 15 reps - Standing Hip Abduction with Counter Support (Mirrored)  - 3 x daily - 7 x weekly - 2 sets - 10 reps - Mini Squat with Counter Support  - 3 x daily - 7 x weekly - 2 sets - 10  reps  ASSESSMENT:  CLINICAL IMPRESSION: Treatment focus on ROM.  Added toe raises for improved DF as well as Single leg stance for improved balance.   PT ROM continues to improve but is still limited causing gait deviation.  PT will need continued skilled therapy to improve ROM for improved function.   OBJECTIVE IMPAIRMENTS Abnormal gait, decreased activity tolerance, decreased balance, decreased endurance, decreased mobility, difficulty walking, decreased ROM, decreased strength, increased edema, impaired flexibility, improper body mechanics, and pain.   ACTIVITY LIMITATIONS lifting, bending, standing, squatting, sleeping, stairs, transfers, bed mobility, bathing, dressing, locomotion level, and caring for others  PARTICIPATION LIMITATIONS: meal prep, cleaning, laundry,  driving, shopping, community activity, and yard work  PERSONAL FACTORS Time since onset of injury/illness/exacerbation and 3+ comorbidities: Anxiety, BMI increased, Hx Cancer, Osteoporosis  are also affecting patient's functional outcome.   REHAB POTENTIAL: Good  CLINICAL DECISION MAKING: Stable/uncomplicated  EVALUATION COMPLEXITY: Low   GOALS: Goals reviewed with patient? Yes  SHORT TERM GOALS: Target date: 04/14/2022  Patient will be independent with HEP in order to improve functional outcomes. Baseline:  Goal status: IN PROGRESS  2.  Patient will report at least 25% improvement in symptoms for improved quality of life. Baseline:  Goal status: IN PROGRESS    LONG TERM GOALS: Target date: 05/05/2022  Patient will report at least 75% improvement in symptoms for improved quality of life. Baseline:  Goal status: IN PROGRESS  2.  Patient will improve FOTO score by at least 25 points in order to indicate improved tolerance to activity. Baseline: 55% function Goal status: IN PROGRESS  3.  Patient will be able to navigate stairs with reciprocal pattern without compensation in order to demonstrate improved LE strength. Baseline: step too pattern reported Goal status: IN PROGRESS  4.  Patient will be able to complete 5x STS in under 11.4 seconds with equal weightbearing in order to demonstrate improved functional strength. Baseline: 14.22 seconds with heavy UE support and reliance on LLE Goal status: IN PROGRESS  5.  Patient will improve ROM for R knee extension/flexion to lacking 5-120 degrees to improve squatting, and other functional mobility. Baseline: lacking 27 to 88 Goal status: IN PROGRESS     PLAN: PT FREQUENCY: 2x/week  PT DURATION: 6 weeks  PLANNED INTERVENTIONS: Therapeutic exercises, Therapeutic activity, Neuromuscular re-education, Balance training, Gait training, Patient/Family education, Joint manipulation, Joint mobilization, Stair training,  Orthotic/Fit training, DME instructions, Aquatic Therapy, Dry Needling, Electrical stimulation, Spinal manipulation, Spinal mobilization, Cryotherapy, Moist heat, Compression bandaging, scar mobilization, Splintting, Taping, Traction, Ultrasound, Ionotophoresis '4mg'$ /ml Dexamethasone, and Manual therapy   PLAN FOR NEXT SESSION: f/u with HEP, promote knee extension/flexion ROM, edema management PRN, strengthening as tolerated  Rayetta Humphrey, PT CLT [phone]  04/03/2022, 1148 AM

## 2022-04-06 ENCOUNTER — Ambulatory Visit (HOSPITAL_COMMUNITY): Payer: Medicare HMO | Attending: Internal Medicine | Admitting: Physical Therapy

## 2022-04-06 DIAGNOSIS — M6281 Muscle weakness (generalized): Secondary | ICD-10-CM | POA: Insufficient documentation

## 2022-04-06 DIAGNOSIS — R29898 Other symptoms and signs involving the musculoskeletal system: Secondary | ICD-10-CM | POA: Insufficient documentation

## 2022-04-06 DIAGNOSIS — R2689 Other abnormalities of gait and mobility: Secondary | ICD-10-CM | POA: Diagnosis not present

## 2022-04-06 DIAGNOSIS — M25561 Pain in right knee: Secondary | ICD-10-CM | POA: Diagnosis not present

## 2022-04-06 DIAGNOSIS — M25661 Stiffness of right knee, not elsewhere classified: Secondary | ICD-10-CM | POA: Insufficient documentation

## 2022-04-06 DIAGNOSIS — Z96651 Presence of right artificial knee joint: Secondary | ICD-10-CM | POA: Insufficient documentation

## 2022-04-06 NOTE — Therapy (Signed)
OUTPATIENT PHYSICAL THERAPY TREATMENT   Patient Name: Evelyn Le MRN: [id number] DOB:1955/12/08, 66 y.o., female Today's Date: 04/06/2022   PT End of Session - 04/06/22 1606     Visit Number 5    Number of Visits 12    Date for PT Re-Evaluation 05/05/22    Authorization Type [employer] NO VL, no auth, medical necessity)    Progress Note Due on Visit 10    PT Start Time 1610    PT Stop Time 1650    PT Time Calculation (min) 40 min    Activity Tolerance Patient tolerated treatment well    Behavior During Therapy WFL for tasks assessed/performed             Past Medical History:  Diagnosis Date   Anxiety    Cancer ([place])    Depression    PONV (postoperative nausea and vomiting)    Past Surgical History:  Procedure Laterality Date   ABDOMINAL HYSTERECTOMY     APPENDECTOMY     HERNIA REPAIR     INCISIONAL HERNIA REPAIR  2010   REPLACEMENT TOTAL KNEE Left    TONSILLECTOMY     TOTAL KNEE ARTHROPLASTY Right 03/03/2022   Procedure: RIGHT TOTAL KNEE ARTHROPLASTY;  Surgeon: [name], MD;  Location: [place];  Service: Orthopedics;  Laterality: Right;  RNFA   Patient Active Problem List   Diagnosis Date Noted   Hospital discharge follow-up 03/06/2022   Status post right knee replacement 03/03/2022   Unilateral primary osteoarthritis, right knee 01/08/2022   Mixed hyperlipidemia 10/09/2021   Vitamin D deficiency 10/09/2021   Prediabetes 10/09/2021   Age-related osteoporosis without current pathological fracture 03/21/2021   Anxiety 11/08/2020   History of ovarian cancer 11/08/2020   History of total knee arthroplasty, left 11/08/2020   Postoperative incisional hernia 11/08/2020    PCP: Ihor Dow MD  REFERRING PROVIDER: [name], MD   REFERRING DIAG: [id number] (ICD-10-CM) - Status post right knee replacement   THERAPY DIAG:  Right knee pain, unspecified chronicity  Muscle weakness (generalized)  Other abnormalities of gait  and mobility  Stiffness of right knee, not elsewhere classified  Other symptoms and signs involving the musculoskeletal system  Rationale for Evaluation and Treatment Rehabilitation  ONSET DATE: 03/03/22  SUBJECTIVE:   SUBJECTIVE STATEMENT:  PT states she worked hard to gain extension over the weekend. States she went without her cane this weekend.   PERTINENT HISTORY: Hx L TKA, HLD, osteoporosis, anxiety, hx ovarian cancer  PAIN:  Are you having pain? no  PRECAUTIONS: None   PATIENT GOALS to go up and down steps like normal, play with grandkids   OBJECTIVE:  Observation: healing surgical incision with steri strips intact, appears to be healing well  PATIENT SURVEYS:  FOTO 55% function  EDEMA:  Circumferential: R 43 cm    POSTURE: No Significant postural limitations  PALPATION: Slight TTP in R quad  LOWER EXTREMITY ROM:  Active ROM Right eval Left eval Right 03/25/22 Right  04/01/2022 Right  04/03/2022 Right  6/19  Hip flexion        Hip extension        Hip abduction        Hip adduction        Hip internal rotation        Hip external rotation        Knee flexion 88 128 94 101 103 105  Knee extension Lacking 27 0 Lacking 12 Lacking 14 Lacking 10 Lacking  9  Ankle dorsiflexion        Ankle plantarflexion        Ankle inversion        Ankle eversion         (Blank rows = not tested)  LOWER EXTREMITY MMT:  MMT Right eval Left eval  Hip flexion 5/5 5/5  Hip extension    Hip abduction    Hip adduction    Hip internal rotation    Hip external rotation    Knee flexion 5/5 5/5  Knee extension 4+/5 5/5  Ankle dorsiflexion 5/5 5/5  Ankle plantarflexion    Ankle inversion    Ankle eversion     (Blank rows = not tested)   FUNCTIONAL TESTS:  5 times sit to stand: 14.22 seconds with heavy UE support and reliance on LLE  GAIT: Distance walked: 100 Assistive device utilized: Single point cane Level of assistance: Modified independence Comments:  antalgic on RLE lacking TKE with limited knee flexion    TODAY'S TREATMENT:                          04/06/22               Bike x 5' full revolution seat at 13               Standing:              Heel raises x 15              Toe raise x 10              Passive hamstring stretch on 12" box 3 x 30"              Knee flexion Rt knee on 12" box and lunge 3 x 30"              Terminal extension x 10              Standing knee flexion Rt x 10               Rocker board x 2'              Slant board x 3 30" hold               Single leg stance x 5              Supine:  quad set x 10                           SAQ x 10                           Active hamstring stretch x 3                            PROM for extension x 3                            Heel slides x 10              Prone:   terminal knee extension x 10              04/03/22  Bike x 5' full revolution seat at 14              Standing:              Heel raises x 15              Toe raise x 10              Passive hamstring stretch on 12" box 3 x 30"              Knee flexion Rt knee on 12" box and lunge 3 x 30"              Terminal extension x 10              Standing knee flexion Rt x 10               Rocker board x 2'              Slant board x 3 30" hold               Single leg stance x 3               Supine:  quad set x 10                           SAQ x 10                           Patella mob                            PROM for extension x 3                            Heel slides x 10                            Active hamstring stretch 3 x 30" with active pull down              04/01/22               Bike x 5' full revolution seat at 15              Standing:              Heel raises x 10              Passive hamstring stretch on 12" box 3 x 30"              Knee flexion Rt knee on 12" box and lunge 3 x 30"              Terminal extension x 10              Standing knee flexion Rt x 10                Rocker board x 2'              Slant board x 3 30" hold               Supine:  quad set x 10  SAQ x 10                           Heel slides x 10                            Active hamstring stretch 3 x 30" with active pull down  03/25/22 Heel slide 10x 5-10 second holds with belt Quad set 10 x 10 second holds SLR R 2x 10  Supine hamstring isometric into green ball 90/90 position 10 x 10 second holds RLE Recumbent bike - rocking for ROM with 5 second holds at end range flexion HR 1x 15 TR 1x 15 Standing hip abduction 2x 10 bilateral  Mini squat 1x 10     03/24/22 Heel slide 10x 5-10 second holds with belt Quad set 10 x 10 second holds   PATIENT EDUCATION:  Education details: Patient educated on exam findings, POC, scope of PT, HEP, elevation for edema, not overdoing activity, and LE positioning at rest. 03/25/22 HEP, walking but not overdoing activity Person educated: Patient Education method: Explanation, Demonstration, and Handouts Education comprehension: verbalized understanding, returned demonstration, verbal cues required, and tactile cues required  HOME EXERCISE PROGRAM:              04/02/2011:  [id number] heel raise, active ham stretch and SAQ 6/6/23Access Code: [email or web address] - Supine Heel Slide with Strap (Mirrored)  - 3-5 x daily - 7 x weekly - 10 reps - 5-10 second hold - Supine Knee Extension Stretch on Towel Roll (Mirrored)  - 3 x daily - 7 x weekly - 15-20 minutes hold 03/25/22 - Long Sitting Quad Set (Mirrored)  - 3 x daily - 7 x weekly - 10 reps - 10 second hold - Active Straight Leg Raise with Quad Set  - 3 x daily - 7 x weekly - 2 sets - 10 reps - Standing Heel Raises  - 3 x daily - 7 x weekly - 15 reps - Toe Raises with Counter Support  - 3 x daily - 7 x weekly - 1 sets - 15 reps - Standing Hip Abduction with Counter Support (Mirrored)  - 3 x daily - 7 x weekly - 2 sets - 10 reps - Mini Squat with Counter Support  - 3 x daily - 7 x weekly -  2 sets - 10 reps  ASSESSMENT:  CLINICAL IMPRESSION: Pt currently ambulating without a cane.  ROM continues to improve but pt still needs increased extension and flexion to be functional.  Begin strengthening once ROM has been obtained.  OBJECTIVE IMPAIRMENTS Abnormal gait, decreased activity tolerance, decreased balance, decreased endurance, decreased mobility, difficulty walking, decreased ROM, decreased strength, increased edema, impaired flexibility, improper body mechanics, and pain.   ACTIVITY LIMITATIONS lifting, bending, standing, squatting, sleeping, stairs, transfers, bed mobility, bathing, dressing, locomotion level, and caring for others  PARTICIPATION LIMITATIONS: meal prep, cleaning, laundry, driving, shopping, community activity, and yard work  PERSONAL FACTORS Time since onset of injury/illness/exacerbation and 3+ comorbidities: Anxiety, BMI increased, Hx Cancer, Osteoporosis  are also affecting patient's functional outcome.   REHAB POTENTIAL: Good  CLINICAL DECISION MAKING: Stable/uncomplicated  EVALUATION COMPLEXITY: Low   GOALS: Goals reviewed with patient? Yes  SHORT TERM GOALS: Target date: 04/14/2022  Patient will be independent with HEP in order to improve functional outcomes. Baseline:  Goal status: MET  2.  Patient will report at least 25% improvement in symptoms for  improved quality of life. Baseline:  Goal status: MET    LONG TERM GOALS: Target date: 05/05/2022  Patient will report at least 75% improvement in symptoms for improved quality of life. Baseline:  Goal status: MET  2.  Patient will improve FOTO score by at least 25 points in order to indicate improved tolerance to activity. Baseline: 55% function Goal status: IN PROGRESS  3.  Patient will be able to navigate stairs with reciprocal pattern without compensation in order to demonstrate improved LE strength. Baseline: step too pattern reported Goal status: IN PROGRESS  4.  Patient will  be able to complete 5x STS in under 11.4 seconds with equal weightbearing in order to demonstrate improved functional strength. Baseline: 14.22 seconds with heavy UE support and reliance on LLE Goal status: IN PROGRESS  5.  Patient will improve ROM for R knee extension/flexion to lacking 5-120 degrees to improve squatting, and other functional mobility. Baseline: lacking 27 to 88 Goal status: IN PROGRESS      PLAN: PT FREQUENCY: 2x/week  PT DURATION: 6 weeks  PLANNED INTERVENTIONS: Therapeutic exercises, Therapeutic activity, Neuromuscular re-education, Balance training, Gait training, Patient/Family education, Joint manipulation, Joint mobilization, Stair training, Orthotic/Fit training, DME instructions, Aquatic Therapy, Dry Needling, Electrical stimulation, Spinal manipulation, Spinal mobilization, Cryotherapy, Moist heat, Compression bandaging, scar mobilization, Splintting, Taping, Traction, Ultrasound, Ionotophoresis 4mg /ml Dexamethasone, and Manual therapy   PLAN FOR NEXT SESSION: f/u with HEP, promote knee extension/flexion ROM, edema management PRN, strengthening as tolerated  Rayetta Humphrey, PT CLT [phone]  04/06/2022,

## 2022-04-08 ENCOUNTER — Ambulatory Visit (HOSPITAL_COMMUNITY): Payer: Medicare HMO | Attending: Internal Medicine | Admitting: Physical Therapy

## 2022-04-08 DIAGNOSIS — R29898 Other symptoms and signs involving the musculoskeletal system: Secondary | ICD-10-CM | POA: Diagnosis not present

## 2022-04-08 DIAGNOSIS — M25561 Pain in right knee: Secondary | ICD-10-CM | POA: Diagnosis not present

## 2022-04-08 DIAGNOSIS — M6281 Muscle weakness (generalized): Secondary | ICD-10-CM | POA: Diagnosis not present

## 2022-04-08 DIAGNOSIS — M25661 Stiffness of right knee, not elsewhere classified: Secondary | ICD-10-CM | POA: Diagnosis not present

## 2022-04-08 DIAGNOSIS — R2689 Other abnormalities of gait and mobility: Secondary | ICD-10-CM | POA: Insufficient documentation

## 2022-04-08 NOTE — Therapy (Signed)
OUTPATIENT PHYSICAL THERAPY TREATMENT   Patient Name: Evelyn Le MRN: [id number] DOB:May 12, 1956, 66 y.o., female Today's Date: 04/08/2022   PT End of Session - 04/08/22 1208     Visit Number 6    Number of Visits 12    Date for PT Re-Evaluation 05/05/22    Authorization Type [employer] Medicare NO VL, no auth, medical necessity)    Progress Note Due on Visit 10    PT Start Time 0833    PT Stop Time 0918    PT Time Calculation (min) 45 min    Activity Tolerance Patient tolerated treatment well    Behavior During Therapy WFL for tasks assessed/performed              Past Medical History:  Diagnosis Date   Anxiety    Cancer ([place])    Depression    PONV (postoperative nausea and vomiting)    Past Surgical History:  Procedure Laterality Date   ABDOMINAL HYSTERECTOMY     APPENDECTOMY     HERNIA REPAIR     INCISIONAL HERNIA REPAIR  2010   REPLACEMENT TOTAL KNEE Left    TONSILLECTOMY     TOTAL KNEE ARTHROPLASTY Right 03/03/2022   Procedure: RIGHT TOTAL KNEE ARTHROPLASTY;  Surgeon: [name], MD;  Location: [facility];  Service: Orthopedics;  Laterality: Right;  RNFA   Patient Active Problem List   Diagnosis Date Noted   Hospital discharge follow-up 03/06/2022   Status post right knee replacement 03/03/2022   Unilateral primary osteoarthritis, right knee 01/08/2022   Mixed hyperlipidemia 10/09/2021   Vitamin D deficiency 10/09/2021   Prediabetes 10/09/2021   Age-related osteoporosis without current pathological fracture 03/21/2021   Anxiety 11/08/2020   History of ovarian cancer 11/08/2020   History of total knee arthroplasty, left 11/08/2020   Postoperative incisional hernia 11/08/2020    PCP: Ihor Dow MD  REFERRING PROVIDER: [name], MD   REFERRING DIAG: [phone] (ICD-10-CM) - Status post right knee replacement   THERAPY DIAG:  Right knee pain, unspecified chronicity  Muscle weakness (generalized)  Other abnormalities of  gait and mobility  Stiffness of right knee, not elsewhere classified  Other symptoms and signs involving the musculoskeletal system  Rationale for Evaluation and Treatment Rehabilitation  ONSET DATE: 03/03/22  SUBJECTIVE:   SUBJECTIVE STATEMENT:  PT states she is overall doing well, sleeping is the hardest for her as she wakes 4-5 times due to her knee getting stiff.  States she tries not to take any pain meds other than ibuprofen.  Comes today without her cane.  PERTINENT HISTORY: Hx L TKA, HLD, osteoporosis, anxiety, hx ovarian cancer  PAIN:  Are you having pain? no  PRECAUTIONS: None   PATIENT GOALS to go up and down steps like normal, play with grandkids   OBJECTIVE:  Observation: healing surgical incision with steri strips intact, appears to be healing well  PATIENT SURVEYS:  FOTO 55% function  EDEMA:  Circumferential: R 43 cm    POSTURE: No Significant postural limitations  PALPATION: Slight TTP in R quad  LOWER EXTREMITY ROM:  Active ROM Right eval Left eval Right 03/25/22 Right  04/01/2022 Right  04/03/2022 Right  6/19  Hip flexion        Hip extension        Hip abduction        Hip adduction        Hip internal rotation        Hip external rotation  Knee flexion 88 128 94 101 103 105  Knee extension Lacking 27 0 Lacking 12 Lacking 14 Lacking 10 Lacking 9  Ankle dorsiflexion        Ankle plantarflexion        Ankle inversion        Ankle eversion         (Blank rows = not tested)  LOWER EXTREMITY MMT:  MMT Right eval Left eval  Hip flexion 5/5 5/5  Hip extension    Hip abduction    Hip adduction    Hip internal rotation    Hip external rotation    Knee flexion 5/5 5/5  Knee extension 4+/5 5/5  Ankle dorsiflexion 5/5 5/5  Ankle plantarflexion    Ankle inversion    Ankle eversion     (Blank rows = not tested)   FUNCTIONAL TESTS:  5 times sit to stand: 14.22 seconds with heavy UE support and reliance on  LLE  GAIT: Distance walked: 100 Assistive device utilized: Single point cane Level of assistance: Modified independence Comments: antalgic on RLE lacking TKE with limited knee flexion    TODAY'S TREATMENT:  04/08/22  Bike 5' seat on 12  Standing:  Heel and toe raises 20X each   Knee flexion Rt AAROM stretch on 8" step 3X30" holds   Hamstring stretch Rt AAROM on 8" step 3X30"   Rt AROM knee flexion 15X   Rt knee lunge onto 4" step no UE assist 10X   Slant board stretch 3X30"   TKE AROM using bodycraft 3PL 10X5"  Supine:  Quad sets 15X5'   SAQ 15X5"  Manual: Retro massage for edema and scar massage to scar line/education   AROM today following manual -10-105                              04/06/22               Bike x 5' full revolution seat at 13               Standing:              Heel raises x 15              Toe raise x 10              Passive hamstring stretch on 12" box 3 x 30"              Knee flexion Rt knee on 12" box and lunge 3 x 30"              Terminal extension x 10              Standing knee flexion Rt x 10               Rocker board x 2'              Slant board x 3 30" hold               Single leg stance x 5              Supine:  quad set x 10                           SAQ x 10  Active hamstring stretch x 3                            PROM for extension x 3                            Heel slides x 10              Prone:   terminal knee extension x 10               04/03/22               Bike x 5' full revolution seat at 14              Standing:              Heel raises x 15              Toe raise x 10              Passive hamstring stretch on 12" box 3 x 30"              Knee flexion Rt knee on 12" box and lunge 3 x 30"              Terminal extension x 10              Standing knee flexion Rt x 10               Rocker board x 2'              Slant board x 3 30" hold               Single leg stance x 3                Supine:  quad set x 10                           SAQ x 10                           Patella mob                            PROM for extension x 3                            Heel slides x 10                            Active hamstring stretch 3 x 30" with active pull down              04/01/22               Bike x 5' full revolution seat at 15              Standing:              Heel raises x 10              Passive hamstring stretch on 12" box 3 x 30"              Knee flexion Rt knee on 12" box  and lunge 3 x 30"              Terminal extension x 10              Standing knee flexion Rt x 10               Rocker board x 2'              Slant board x 3 30" hold               Supine:  quad set x 10                           SAQ x 10                           Heel slides x 10                            Active hamstring stretch 3 x 30" with active pull down  03/25/22 Heel slide 10x 5-10 second holds with belt Quad set 10 x 10 second holds SLR R 2x 10  Supine hamstring isometric into green ball 90/90 position 10 x 10 second holds RLE Recumbent bike - rocking for ROM with 5 second holds at end range flexion HR 1x 15 TR 1x 15 Standing hip abduction 2x 10 bilateral  Mini squat 1x 10     03/24/22 Heel slide 10x 5-10 second holds with belt Quad set 10 x 10 second holds   PATIENT EDUCATION:  Education details: Patient educated on exam findings, POC, scope of PT, HEP, elevation for edema, not overdoing activity, and LE positioning at rest. 03/25/22 HEP, walking but not overdoing activity Person educated: Patient Education method: Explanation, Demonstration, and Handouts Education comprehension: verbalized understanding, returned demonstration, verbal cues required, and tactile cues required  HOME EXERCISE PROGRAM:              04/02/2011:  [id number] heel raise, active ham stretch and SAQ 6/6/23Access Code: [email or web address] - Supine Heel Slide with Strap (Mirrored)  - 3-5 x daily - 7 x weekly - 10  reps - 5-10 second hold - Supine Knee Extension Stretch on Towel Roll (Mirrored)  - 3 x daily - 7 x weekly - 15-20 minutes hold 03/25/22 - Long Sitting Quad Set (Mirrored)  - 3 x daily - 7 x weekly - 10 reps - 10 second hold - Active Straight Leg Raise with Quad Set  - 3 x daily - 7 x weekly - 2 sets - 10 reps - Standing Heel Raises  - 3 x daily - 7 x weekly - 15 reps - Toe Raises with Counter Support  - 3 x daily - 7 x weekly - 1 sets - 15 reps - Standing Hip Abduction with Counter Support (Mirrored)  - 3 x daily - 7 x weekly - 2 sets - 10 reps - Mini Squat with Counter Support  - 3 x daily - 7 x weekly - 2 sets - 10 reps  ASSESSMENT:  CLINICAL IMPRESSION: Pt currently ambulating without a cane.  Extension remains most limited at this time with continued focus on ROM.  Began lunge on 4" step to work on LE stability and TKEs using the pulley machine to encourage extension.  Manual began this session as with noted edema/induration and scar tightness.  Educated on  compression stocking, however pt would not like to try this at this time due to stating it does not always swell.  Encouraged to complete self manual at home to help improve this.    Begin strengthening once ROM has been obtained.  Pt will continue to benefit from skilled therapy.   OBJECTIVE IMPAIRMENTS Abnormal gait, decreased activity tolerance, decreased balance, decreased endurance, decreased mobility, difficulty walking, decreased ROM, decreased strength, increased edema, impaired flexibility, improper body mechanics, and pain.   ACTIVITY LIMITATIONS lifting, bending, standing, squatting, sleeping, stairs, transfers, bed mobility, bathing, dressing, locomotion level, and caring for others  PARTICIPATION LIMITATIONS: meal prep, cleaning, laundry, driving, shopping, community activity, and yard work  PERSONAL FACTORS Time since onset of injury/illness/exacerbation and 3+ comorbidities: Anxiety, BMI increased, Hx Cancer, Osteoporosis   are also affecting patient's functional outcome.   REHAB POTENTIAL: Good  CLINICAL DECISION MAKING: Stable/uncomplicated  EVALUATION COMPLEXITY: Low   GOALS: Goals reviewed with patient? Yes  SHORT TERM GOALS: Target date: 04/14/2022  Patient will be independent with HEP in order to improve functional outcomes. Baseline:  Goal status: MET  2.  Patient will report at least 25% improvement in symptoms for improved quality of life. Baseline:  Goal status: MET    LONG TERM GOALS: Target date: 05/05/2022  Patient will report at least 75% improvement in symptoms for improved quality of life. Baseline:  Goal status: MET  2.  Patient will improve FOTO score by at least 25 points in order to indicate improved tolerance to activity. Baseline: 55% function Goal status: IN PROGRESS  3.  Patient will be able to navigate stairs with reciprocal pattern without compensation in order to demonstrate improved LE strength. Baseline: step too pattern reported Goal status: IN PROGRESS  4.  Patient will be able to complete 5x STS in under 11.4 seconds with equal weightbearing in order to demonstrate improved functional strength. Baseline: 14.22 seconds with heavy UE support and reliance on LLE Goal status: IN PROGRESS  5.  Patient will improve ROM for R knee extension/flexion to lacking 5-120 degrees to improve squatting, and other functional mobility. Baseline: lacking 27 to 88 Goal status: IN PROGRESS      PLAN: PT FREQUENCY: 2x/week  PT DURATION: 6 weeks  PLANNED INTERVENTIONS: Therapeutic exercises, Therapeutic activity, Neuromuscular re-education, Balance training, Gait training, Patient/Family education, Joint manipulation, Joint mobilization, Stair training, Orthotic/Fit training, DME instructions, Aquatic Therapy, Dry Needling, Electrical stimulation, Spinal manipulation, Spinal mobilization, Cryotherapy, Moist heat, Compression bandaging, scar mobilization, Splintting, Taping,  Traction, Ultrasound, Ionotophoresis 4mg /ml Dexamethasone, and Manual therapy   PLAN FOR NEXT SESSION: Promote knee extension/flexion ROM, edema management PRN, strengthening as tolerated   Teena Irani, PTA/CLT [place] Ph: [phone]  04/08/2022,

## 2022-04-09 ENCOUNTER — Ambulatory Visit: Payer: Medicare Other | Admitting: Internal Medicine

## 2022-04-10 ENCOUNTER — Encounter (HOSPITAL_COMMUNITY): Payer: Medicare HMO

## 2022-04-13 ENCOUNTER — Encounter (HOSPITAL_COMMUNITY): Payer: Self-pay | Admitting: Physical Therapy

## 2022-04-13 ENCOUNTER — Ambulatory Visit (HOSPITAL_COMMUNITY): Payer: Medicare HMO | Attending: Internal Medicine | Admitting: Physical Therapy

## 2022-04-13 DIAGNOSIS — R2689 Other abnormalities of gait and mobility: Secondary | ICD-10-CM | POA: Diagnosis not present

## 2022-04-13 DIAGNOSIS — M25561 Pain in right knee: Secondary | ICD-10-CM | POA: Diagnosis not present

## 2022-04-13 DIAGNOSIS — M6281 Muscle weakness (generalized): Secondary | ICD-10-CM | POA: Insufficient documentation

## 2022-04-13 DIAGNOSIS — R29898 Other symptoms and signs involving the musculoskeletal system: Secondary | ICD-10-CM | POA: Diagnosis not present

## 2022-04-13 DIAGNOSIS — M25661 Stiffness of right knee, not elsewhere classified: Secondary | ICD-10-CM | POA: Insufficient documentation

## 2022-04-15 ENCOUNTER — Encounter (HOSPITAL_COMMUNITY): Payer: Medicare HMO | Admitting: Physical Therapy

## 2022-04-15 ENCOUNTER — Ambulatory Visit (INDEPENDENT_AMBULATORY_CARE_PROVIDER_SITE_OTHER): Payer: Medicare HMO | Admitting: Orthopaedic Surgery

## 2022-04-15 ENCOUNTER — Encounter: Payer: Self-pay | Admitting: Orthopaedic Surgery

## 2022-04-15 DIAGNOSIS — Z96651 Presence of right artificial knee joint: Secondary | ICD-10-CM

## 2022-04-15 NOTE — Progress Notes (Signed)
The patient is 6 weeks status post a right total knee arthroplasty.  She has a history of a left knee replaced several years ago.  She is doing well overall.  She feels like she is ready to drive.  She is not taking any medications.  Examination of her right knee shows it does like full extension by several degrees and I can only flex her to about 95 degrees.  However she is making progress and feels good about her range of motion and strength.  She is still in physical therapy and I agree with this as well.  Of note her left total knee arthroplasty has excellent and full motion.  She will continue to push herself the range of motion of her right knee.  I would like to see her back in 4 weeks to see how she is doing overall.  We can have an AP and lateral of her right knee at that visit.

## 2022-04-16 ENCOUNTER — Encounter (HOSPITAL_COMMUNITY): Payer: Self-pay

## 2022-04-16 ENCOUNTER — Ambulatory Visit (HOSPITAL_COMMUNITY): Payer: Medicare HMO

## 2022-04-16 DIAGNOSIS — M6281 Muscle weakness (generalized): Secondary | ICD-10-CM

## 2022-04-16 DIAGNOSIS — M25661 Stiffness of right knee, not elsewhere classified: Secondary | ICD-10-CM | POA: Diagnosis not present

## 2022-04-16 DIAGNOSIS — M25561 Pain in right knee: Secondary | ICD-10-CM

## 2022-04-16 DIAGNOSIS — R2689 Other abnormalities of gait and mobility: Secondary | ICD-10-CM | POA: Diagnosis not present

## 2022-04-16 DIAGNOSIS — R29898 Other symptoms and signs involving the musculoskeletal system: Secondary | ICD-10-CM | POA: Diagnosis not present

## 2022-04-16 NOTE — Therapy (Signed)
OUTPATIENT PHYSICAL THERAPY TREATMENT   Patient Name: Evelyn Le MRN: [id number] DOB:July 20, 1956, 66 y.o., female Today's Date: 04/16/2022  Progress Note Reporting Period 03/24/22 to 04/13/22  See note below for Objective Data and Assessment of Progress/Goals.      PT End of Session - 04/16/22 1059     Visit Number 8    Number of Visits 12    Date for PT Re-Evaluation 05/05/22    Authorization Type [employer] NO VL, no auth, medical necessity - follow Medicare guidelines    Progress Note Due on Visit 10    PT Start Time 1055    PT Stop Time 1135    PT Time Calculation (min) 40 min    Activity Tolerance Patient tolerated treatment well    Behavior During Therapy WFL for tasks assessed/performed              Past Medical History:  Diagnosis Date   Anxiety    Cancer ([place])    Depression    PONV (postoperative nausea and vomiting)    Past Surgical History:  Procedure Laterality Date   ABDOMINAL HYSTERECTOMY     APPENDECTOMY     HERNIA REPAIR     INCISIONAL HERNIA REPAIR  2010   REPLACEMENT TOTAL KNEE Left    TONSILLECTOMY     TOTAL KNEE ARTHROPLASTY Right 03/03/2022   Procedure: RIGHT TOTAL KNEE ARTHROPLASTY;  Surgeon: [name], MD;  Location: [facility];  Service: Orthopedics;  Laterality: Right;  RNFA   Patient Active Problem List   Diagnosis Date Noted   Hospital discharge follow-up 03/06/2022   Status post right knee replacement 03/03/2022   Unilateral primary osteoarthritis, right knee 01/08/2022   Mixed hyperlipidemia 10/09/2021   Vitamin D deficiency 10/09/2021   Prediabetes 10/09/2021   Age-related osteoporosis without current pathological fracture 03/21/2021   Anxiety 11/08/2020   History of ovarian cancer 11/08/2020   History of total knee arthroplasty, left 11/08/2020   Postoperative incisional hernia 11/08/2020    PCP: Ihor Dow MD  REFERRING PROVIDER: [name], MD   REFERRING DIAG: [phone] (ICD-10-CM) -  Status post right knee replacement   THERAPY DIAG:  Right knee pain, unspecified chronicity  Muscle weakness (generalized)  Other abnormalities of gait and mobility  Stiffness of right knee, not elsewhere classified  Other symptoms and signs involving the musculoskeletal system  Rationale for Evaluation and Treatment Rehabilitation  ONSET DATE: 03/03/22  SUBJECTIVE:   SUBJECTIVE STATEMENT:  Patient says she is doing well. Had MD follow up yesterday, took out some internal stitches that were pushing through.  Patient reports that MD "told me to work on my extension"   PERTINENT HISTORY: Hx L TKA, HLD, osteoporosis, anxiety, hx ovarian cancer  PAIN:  Are you having pain? No (just soreness)   PRECAUTIONS: None   PATIENT GOALS to go up and down steps like normal, play with grandkids   OBJECTIVE:  Observation: healing surgical incision with steri strips intact, appears to be healing well  PATIENT SURVEYS:  FOTO 55% function  EDEMA:  Circumferential: R 43 cm    POSTURE: No Significant postural limitations  PALPATION: Slight TTP in R quad  LOWER EXTREMITY ROM:  Active ROM Right eval Left eval Right 03/25/22 Right  04/01/2022 Right  04/03/2022 Right  6/19 Right 04/13/22  Hip flexion         Hip extension         Hip abduction         Hip adduction  Hip internal rotation         Hip external rotation         Knee flexion 88 128 94 101 103 105 105  Knee extension Lacking 27 0 Lacking 12 Lacking 14 Lacking 10 Lacking 9 -10  Ankle dorsiflexion         Ankle plantarflexion         Ankle inversion         Ankle eversion          (Blank rows = not tested)  LOWER EXTREMITY MMT:  MMT Right eval Left eval  Hip flexion 5/5 5/5  Hip extension    Hip abduction    Hip adduction    Hip internal rotation    Hip external rotation    Knee flexion 5/5 5/5  Knee extension 4+/5 5/5  Ankle dorsiflexion 5/5 5/5  Ankle plantarflexion    Ankle inversion     Ankle eversion     (Blank rows = not tested)   FUNCTIONAL TESTS:  5 times sit to stand: 14.22 seconds with heavy UE support and reliance on LLE  GAIT: Distance walked: 100 Assistive device utilized: Single point cane Level of assistance: Modified independence Comments: antalgic on RLE lacking TKE with limited knee flexion    TODAY'S TREATMENT:  04/16/22  There-Act = Dynamic ROM and warmup on recumbent bike, full circles, cue for full extension x 8 mins   - Stairs up and down both sides in 8 rounds with cue for tall posture and slow control ecct.   - Sit to Stand x 20 reps x 2 sets with no hands and with cue for full ROM control  There-Ex =    - Bodycraft leg press, plate 4, 2 set x 10 reps, cue for max extension and distal quad activation   - Hell raises in // bars 1 x 10 reps flat, then 1 x 20 reps with small heel drop and cue for full knee ext   - Step up at stairs (8inch) x 20 reps R LE    - Lateral step up at stairs x 20 reps R LE   - Step down ecct control at stairs x 20 reps R LE   - Sitting hamstring stretch for knee extension with foot on 2nd step x 60 sec hold x 2 rounds    - Knee flexion stretch sitting on 2nd step, self lowering into flexion squat 2 x 60 sec hold   04/13/22 Reassess FOTO 73% STS x 5 Rec bike (seat 11) full circles  Heel raise x20 Calf stretch slant board 3 x 30" Step ups 6 inch 2 x 10 single HHA  Single leg balance 15" max bilateral  TKE  4 plates x 10 Stairs 7 inch 3 RT single rail, reciprocal    04/08/22  Bike 5' seat on 12  Standing:  Heel and toe raises 20X each   Knee flexion Rt AAROM stretch on 8" step 3X30" holds   Hamstring stretch Rt AAROM on 8" step 3X30"   Rt AROM knee flexion 15X   Rt knee lunge onto 4" step no UE assist 10X   Slant board stretch 3X30"   TKE AROM using bodycraft 3PL 10X5"  Supine:  Quad sets 15X5'   SAQ 15X5"  Manual: Retro massage for edema and scar massage to scar line/education   AROM today following  manual -10-105  04/06/22               Bike x 5' full revolution seat at 13               Standing:              Heel raises x 15              Toe raise x 10              Passive hamstring stretch on 12" box 3 x 30"              Knee flexion Rt knee on 12" box and lunge 3 x 30"              Terminal extension x 10              Standing knee flexion Rt x 10               Rocker board x 2'              Slant board x 3 30" hold               Single leg stance x 5              Supine:  quad set x 10                           SAQ x 10                           Active hamstring stretch x 3                            PROM for extension x 3                            Heel slides x 10              Prone:   terminal knee extension x 10               04/03/22               Bike x 5' full revolution seat at 14              Standing:              Heel raises x 15              Toe raise x 10              Passive hamstring stretch on 12" box 3 x 30"              Knee flexion Rt knee on 12" box and lunge 3 x 30"              Terminal extension x 10              Standing knee flexion Rt x 10               Rocker board x 2'              Slant board x 3 30" hold               Single leg stance x 3  Supine:  quad set x 10                           SAQ x 10                           Patella mob                            PROM for extension x 3                            Heel slides x 10                            Active hamstring stretch 3 x 30" with active pull down              04/01/22               Bike x 5' full revolution seat at 15              Standing:              Heel raises x 10              Passive hamstring stretch on 12" box 3 x 30"              Knee flexion Rt knee on 12" box and lunge 3 x 30"              Terminal extension x 10              Standing knee flexion Rt x 10               Rocker board x 2'              Slant board x 3  30" hold               Supine:  quad set x 10                           SAQ x 10                           Heel slides x 10                            Active hamstring stretch 3 x 30" with active pull down  03/25/22 Heel slide 10x 5-10 second holds with belt Quad set 10 x 10 second holds SLR R 2x 10  Supine hamstring isometric into green ball 90/90 position 10 x 10 second holds RLE Recumbent bike - rocking for ROM with 5 second holds at end range flexion HR 1x 15 TR 1x 15 Standing hip abduction 2x 10 bilateral  Mini squat 1x 10     03/24/22 Heel slide 10x 5-10 second holds with belt Quad set 10 x 10 second holds   PATIENT EDUCATION:  Education details: Patient educated on exam findings, POC, scope of PT, HEP, elevation for edema, not overdoing activity, and LE positioning at rest. 03/25/22 HEP, walking but not overdoing activity 04/16/22: ROM needs, stretch full extension  Person educated: Patient Education  method: Explanation, Demonstration, and Handouts Education comprehension: verbalized understanding, returned demonstration, verbal cues required, and tactile cues required  HOME EXERCISE PROGRAM:              04/02/2011:  [id number] heel raise, active ham stretch and SAQ 6/6/23Access Code: [email or web address] - Supine Heel Slide with Strap (Mirrored)  - 3-5 x daily - 7 x weekly - 10 reps - 5-10 second hold - Supine Knee Extension Stretch on Towel Roll (Mirrored)  - 3 x daily - 7 x weekly - 15-20 minutes hold 03/25/22 - Long Sitting Quad Set (Mirrored)  - 3 x daily - 7 x weekly - 10 reps - 10 second hold - Active Straight Leg Raise with Quad Set  - 3 x daily - 7 x weekly - 2 sets - 10 reps - Standing Heel Raises  - 3 x daily - 7 x weekly - 15 reps - Toe Raises with Counter Support  - 3 x daily - 7 x weekly - 1 sets - 15 reps - Standing Hip Abduction with Counter Support (Mirrored)  - 3 x daily - 7 x weekly - 2 sets - 10 reps - Mini Squat with Counter Support  - 3 x daily - 7 x weekly - 2 sets - 10  reps  ASSESSMENT:  CLINICAL IMPRESSION: Today's session continued to focus on exercises and activities to promote strength through full ROM.  Patient continues to be mainly limited by lack of full ROM in both extension and flexion with continued edema that could affect limitations in function.  Thus, continued work on cueing full ROM in activities, especially control into flexion and lift into full extension as able.  To address flexion concerns, new stair stretch utilized with increased weight bearing with good patient tolerance.  Patient will continue to benefit from skilled therapy services to address remaining deficits for improved functional ability.   OBJECTIVE IMPAIRMENTS Abnormal gait, decreased activity tolerance, decreased balance, decreased endurance, decreased mobility, difficulty walking, decreased ROM, decreased strength, increased edema, impaired flexibility, improper body mechanics, and pain.   ACTIVITY LIMITATIONS lifting, bending, standing, squatting, sleeping, stairs, transfers, bed mobility, bathing, dressing, locomotion level, and caring for others  PARTICIPATION LIMITATIONS: meal prep, cleaning, laundry, driving, shopping, community activity, and yard work  PERSONAL FACTORS Time since onset of injury/illness/exacerbation and 3+ comorbidities: Anxiety, BMI increased, Hx Cancer, Osteoporosis  are also affecting patient's functional outcome.   REHAB POTENTIAL: Good  CLINICAL DECISION MAKING: Stable/uncomplicated  EVALUATION COMPLEXITY: Low   GOALS: Goals reviewed with patient? Yes  SHORT TERM GOALS: Target date: 04/14/2022  Patient will be independent with HEP in order to improve functional outcomes. Baseline:  Goal status: MET  2.  Patient will report at least 25% improvement in symptoms for improved quality of life. Baseline:  Goal status: MET    LONG TERM GOALS: Target date: 05/05/2022  Patient will report at least 75% improvement in symptoms for improved  quality of life. Baseline: Reports 90%  Goal status: MET  2.  Patient will improve FOTO score by at least 25 points in order to indicate improved tolerance to activity. Baseline: 73% function Goal status: Ongoing   3.  Patient will be able to navigate stairs with reciprocal pattern without compensation in order to demonstrate improved LE strength. Baseline: Can do 7 inch step, reciprocal with single rail  Goal status: MET  4.  Patient will be able to complete 5x STS in under 11.4 seconds with equal weightbearing in order to demonstrate improved  functional strength. Baseline: 11.04 sec with no UE  Goal status: MET  5.  Patient will improve ROM for R knee extension/flexion to lacking 5-120 degrees to improve squatting, and other functional mobility. Baseline: -10 to 105 deg  Goal status: IN PROGRESS      PLAN: PT FREQUENCY: 2x/week  PT DURATION: 6 weeks  PLANNED INTERVENTIONS: Therapeutic exercises, Therapeutic activity, Neuromuscular re-education, Balance training, Gait training, Patient/Family education, Joint manipulation, Joint mobilization, Stair training, Orthotic/Fit training, DME instructions, Aquatic Therapy, Dry Needling, Electrical stimulation, Spinal manipulation, Spinal mobilization, Cryotherapy, Moist heat, Compression bandaging, scar mobilization, Splintting, Taping, Traction, Ultrasound, Ionotophoresis 69m/ml Dexamethasone, and Manual therapy   PLAN FOR NEXT SESSION: Promote knee extension/flexion ROM, edema management PRN, strengthening as tolerated   11:01 AM, 04/16/22  PMargarette Asal CCarlis AbbottPT, DPT  Contract Physical Therapist at  [phone]

## 2022-04-17 ENCOUNTER — Encounter (HOSPITAL_COMMUNITY): Payer: Medicare HMO

## 2022-04-20 ENCOUNTER — Ambulatory Visit (HOSPITAL_COMMUNITY): Payer: Medicare HMO | Attending: Internal Medicine | Admitting: Physical Therapy

## 2022-04-20 ENCOUNTER — Encounter (HOSPITAL_COMMUNITY): Payer: Self-pay | Admitting: Physical Therapy

## 2022-04-20 DIAGNOSIS — M6281 Muscle weakness (generalized): Secondary | ICD-10-CM | POA: Insufficient documentation

## 2022-04-20 DIAGNOSIS — R2689 Other abnormalities of gait and mobility: Secondary | ICD-10-CM | POA: Diagnosis not present

## 2022-04-20 DIAGNOSIS — M25661 Stiffness of right knee, not elsewhere classified: Secondary | ICD-10-CM | POA: Insufficient documentation

## 2022-04-20 DIAGNOSIS — M25561 Pain in right knee: Secondary | ICD-10-CM | POA: Insufficient documentation

## 2022-04-20 DIAGNOSIS — R29898 Other symptoms and signs involving the musculoskeletal system: Secondary | ICD-10-CM | POA: Insufficient documentation

## 2022-04-20 NOTE — Therapy (Signed)
OUTPATIENT PHYSICAL THERAPY TREATMENT   Patient Name: Evelyn Le MRN: [id number] DOB:27-Aug-1956, 66 y.o., female Today's Date: 04/20/2022     PT End of Session - 04/20/22 1340     Visit Number 9    Number of Visits 12    Date for PT Re-Evaluation 05/05/22    Authorization Type [employer] NO VL, no auth, medical necessity - follow Medicare guidelines    Progress Note Due on Visit 17    PT Start Time 1342    PT Stop Time 1428    PT Time Calculation (min) 46 min    Activity Tolerance Patient tolerated treatment well    Behavior During Therapy WFL for tasks assessed/performed              Past Medical History:  Diagnosis Date   Anxiety    Cancer ([place])    Depression    PONV (postoperative nausea and vomiting)    Past Surgical History:  Procedure Laterality Date   ABDOMINAL HYSTERECTOMY     APPENDECTOMY     HERNIA REPAIR     INCISIONAL HERNIA REPAIR  2010   REPLACEMENT TOTAL KNEE Left    TONSILLECTOMY     TOTAL KNEE ARTHROPLASTY Right 03/03/2022   Procedure: RIGHT TOTAL KNEE ARTHROPLASTY;  Surgeon: [name], MD;  Location: [place];  Service: Orthopedics;  Laterality: Right;  RNFA   Patient Active Problem List   Diagnosis Date Noted   Hospital discharge follow-up 03/06/2022   Status post right knee replacement 03/03/2022   Unilateral primary osteoarthritis, right knee 01/08/2022   Mixed hyperlipidemia 10/09/2021   Vitamin D deficiency 10/09/2021   Prediabetes 10/09/2021   Age-related osteoporosis without current pathological fracture 03/21/2021   Anxiety 11/08/2020   History of ovarian cancer 11/08/2020   History of total knee arthroplasty, left 11/08/2020   Postoperative incisional hernia 11/08/2020    PCP: Ihor Dow MD  REFERRING PROVIDER: [name], MD   REFERRING DIAG: [phone] (ICD-10-CM) - Status post right knee replacement   THERAPY DIAG:  Right knee pain, unspecified chronicity  Muscle weakness  (generalized)  Other abnormalities of gait and mobility  Stiffness of right knee, not elsewhere classified  Other symptoms and signs involving the musculoskeletal system  Rationale for Evaluation and Treatment Rehabilitation  ONSET DATE: 03/03/22  SUBJECTIVE:   SUBJECTIVE STATEMENT:  Patient says continued difficulty with bending and straightening her knee. Functionally feels like things are improving.   PERTINENT HISTORY: Hx L TKA, HLD, osteoporosis, anxiety, hx ovarian cancer  PAIN:  Are you having pain? No (just soreness)   PRECAUTIONS: None   PATIENT GOALS to go up and down steps like normal, play with grandkids   OBJECTIVE:  Observation: healing surgical incision with steri strips intact, appears to be healing well  PATIENT SURVEYS:  FOTO 55% function  EDEMA:  Circumferential: R 43 cm    POSTURE: No Significant postural limitations  PALPATION: Slight TTP in R quad  LOWER EXTREMITY ROM:  Active ROM Right eval Left eval Right 03/25/22 Right  04/01/2022 Right  04/03/2022 Right  6/19 Right 04/13/22 Right 04/20/22  Hip flexion          Hip extension          Hip abduction          Hip adduction          Hip internal rotation          Hip external rotation  Knee flexion 88 128 94 101 103 105 105 104  Knee extension Lacking 27 0 Lacking 12 Lacking 14 Lacking 10 Lacking 9 -10 -10  Ankle dorsiflexion          Ankle plantarflexion          Ankle inversion          Ankle eversion           (Blank rows = not tested)  LOWER EXTREMITY MMT:  MMT Right eval Left eval  Hip flexion 5/5 5/5  Hip extension    Hip abduction    Hip adduction    Hip internal rotation    Hip external rotation    Knee flexion 5/5 5/5  Knee extension 4+/5 5/5  Ankle dorsiflexion 5/5 5/5  Ankle plantarflexion    Ankle inversion    Ankle eversion     (Blank rows = not tested)   FUNCTIONAL TESTS:  5 times sit to stand: 14.22 seconds with heavy UE support and reliance  on LLE  GAIT: Distance walked: 100 Assistive device utilized: Single point cane Level of assistance: Modified independence Comments: antalgic on RLE lacking TKE with limited knee flexion    TODAY'S TREATMENT: 04/20/22 Bike 5 minutes for dynamic warm up and ROM Discussion of POC Prone knee flexion stretch 3x 20 second holds RLE Stairs 2RT 7 inch Squat x 5 with UE support      04/16/22  There-Act = Dynamic ROM and warmup on recumbent bike, full circles, cue for full extension x 8 mins   - Stairs up and down both sides in 8 rounds with cue for tall posture and slow control ecct.   - Sit to Stand x 20 reps x 2 sets with no hands and with cue for full ROM control  There-Ex =    - Bodycraft leg press, plate 4, 2 set x 10 reps, cue for max extension and distal quad activation   - Hell raises in // bars 1 x 10 reps flat, then 1 x 20 reps with small heel drop and cue for full knee ext   - Step up at stairs (8inch) x 20 reps R LE    - Lateral step up at stairs x 20 reps R LE   - Step down ecct control at stairs x 20 reps R LE   - Sitting hamstring stretch for knee extension with foot on 2nd step x 60 sec hold x 2 rounds    - Knee flexion stretch sitting on 2nd step, self lowering into flexion squat 2 x 60 sec hold   04/13/22 Reassess FOTO 73% STS x 5 Rec bike (seat 11) full circles  Heel raise x20 Calf stretch slant board 3 x 30" Step ups 6 inch 2 x 10 single HHA  Single leg balance 15" max bilateral  TKE  4 plates x 10 Stairs 7 inch 3 RT single rail, reciprocal    04/08/22  Bike 5' seat on 12  Standing:  Heel and toe raises 20X each   Knee flexion Rt AAROM stretch on 8" step 3X30" holds   Hamstring stretch Rt AAROM on 8" step 3X30"   Rt AROM knee flexion 15X   Rt knee lunge onto 4" step no UE assist 10X   Slant board stretch 3X30"   TKE AROM using bodycraft 3PL 10X5"  Supine:  Quad sets 15X5'   SAQ 15X5"  Manual: Retro massage for edema and scar massage to scar  line/education  AROM today following manual -10-105                              04/06/22               Bike x 5' full revolution seat at 13               Standing:              Heel raises x 15              Toe raise x 10              Passive hamstring stretch on 12" box 3 x 30"              Knee flexion Rt knee on 12" box and lunge 3 x 30"              Terminal extension x 10              Standing knee flexion Rt x 10               Rocker board x 2'              Slant board x 3 30" hold               Single leg stance x 5              Supine:  quad set x 10                           SAQ x 10                           Active hamstring stretch x 3                            PROM for extension x 3                            Heel slides x 10              Prone:   terminal knee extension x 10               04/03/22               Bike x 5' full revolution seat at 14              Standing:              Heel raises x 15              Toe raise x 10              Passive hamstring stretch on 12" box 3 x 30"              Knee flexion Rt knee on 12" box and lunge 3 x 30"              Terminal extension x 10              Standing knee flexion Rt x 10               Rocker board x 2'  Slant board x 3 30" hold               Single leg stance x 3               Supine:  quad set x 10                           SAQ x 10                           Patella mob                            PROM for extension x 3                            Heel slides x 10                            Active hamstring stretch 3 x 30" with active pull down              04/01/22               Bike x 5' full revolution seat at 15              Standing:              Heel raises x 10              Passive hamstring stretch on 12" box 3 x 30"              Knee flexion Rt knee on 12" box and lunge 3 x 30"              Terminal extension x 10              Standing knee flexion Rt x 10               Rocker  board x 2'              Slant board x 3 30" hold               Supine:  quad set x 10                           SAQ x 10                           Heel slides x 10                            Active hamstring stretch 3 x 30" with active pull down  03/25/22 Heel slide 10x 5-10 second holds with belt Quad set 10 x 10 second holds SLR R 2x 10  Supine hamstring isometric into green ball 90/90 position 10 x 10 second holds RLE Recumbent bike - rocking for ROM with 5 second holds at end range flexion HR 1x 15 TR 1x 15 Standing hip abduction 2x 10 bilateral  Mini squat 1x 10     03/24/22 Heel slide 10x 5-10 second holds with belt Quad set 10 x 10 second holds   PATIENT EDUCATION:  Education details:  Patient educated on exam findings, POC, scope of PT, HEP, elevation for edema, not overdoing activity, and LE positioning at rest. 03/25/22 HEP, walking but not overdoing activity 04/16/22: ROM needs, stretch full extension  Person educated: Patient Education method: Explanation, Demonstration, and Handouts Education comprehension: verbalized understanding, returned demonstration, verbal cues required, and tactile cues required  HOME EXERCISE PROGRAM:              04/02/2011:  [id number] heel raise, active ham stretch and SAQ 6/6/23Access Code: [email or web address] - Supine Heel Slide with Strap (Mirrored)  - 3-5 x daily - 7 x weekly - 10 reps - 5-10 second hold - Supine Knee Extension Stretch on Towel Roll (Mirrored)  - 3 x daily - 7 x weekly - 15-20 minutes hold 03/25/22 - Long Sitting Quad Set (Mirrored)  - 3 x daily - 7 x weekly - 10 reps - 10 second hold - Active Straight Leg Raise with Quad Set  - 3 x daily - 7 x weekly - 2 sets - 10 reps - Standing Heel Raises  - 3 x daily - 7 x weekly - 15 reps - Toe Raises with Counter Support  - 3 x daily - 7 x weekly - 1 sets - 15 reps - Standing Hip Abduction with Counter Support (Mirrored)  - 3 x daily - 7 x weekly - 2 sets - 10 reps - Mini Squat with Counter  Support  - 3 x daily - 7 x weekly - 2 sets - 10 reps  ASSESSMENT:  CLINICAL IMPRESSION: Patient with questions regarding nearing end of POC. Discussed POC and progress made since beginning PT and educated on remaining deficit. Discussed LE positioning and exercises for ROM as patient feels this is greatest limitation. Patient with minimal functional limitation at this point but has end range ROM restrictions and edema. Educated on importance of positioning at rest and also thinking about what she needs from PT whether d/c to HEP or extending POC. Patient will continue to benefit from physical therapy in order to improve function and reduce impairment.   OBJECTIVE IMPAIRMENTS Abnormal gait, decreased activity tolerance, decreased balance, decreased endurance, decreased mobility, difficulty walking, decreased ROM, decreased strength, increased edema, impaired flexibility, improper body mechanics, and pain.   ACTIVITY LIMITATIONS lifting, bending, standing, squatting, sleeping, stairs, transfers, bed mobility, bathing, dressing, locomotion level, and caring for others  PARTICIPATION LIMITATIONS: meal prep, cleaning, laundry, driving, shopping, community activity, and yard work  PERSONAL FACTORS Time since onset of injury/illness/exacerbation and 3+ comorbidities: Anxiety, BMI increased, Hx Cancer, Osteoporosis  are also affecting patient's functional outcome.   REHAB POTENTIAL: Good  CLINICAL DECISION MAKING: Stable/uncomplicated  EVALUATION COMPLEXITY: Low   GOALS: Goals reviewed with patient? Yes  SHORT TERM GOALS: Target date: 04/14/2022  Patient will be independent with HEP in order to improve functional outcomes. Baseline:  Goal status: MET  2.  Patient will report at least 25% improvement in symptoms for improved quality of life. Baseline:  Goal status: MET    LONG TERM GOALS: Target date: 05/05/2022  Patient will report at least 75% improvement in symptoms for improved  quality of life. Baseline: Reports 90%  Goal status: MET  2.  Patient will improve FOTO score by at least 25 points in order to indicate improved tolerance to activity. Baseline: 73% function Goal status: Ongoing   3.  Patient will be able to navigate stairs with reciprocal pattern without compensation in order to demonstrate improved LE strength. Baseline: Can do 7 inch  step, reciprocal with single rail  Goal status: MET  4.  Patient will be able to complete 5x STS in under 11.4 seconds with equal weightbearing in order to demonstrate improved functional strength. Baseline: 11.04 sec with no UE  Goal status: MET  5.  Patient will improve ROM for R knee extension/flexion to lacking 5-120 degrees to improve squatting, and other functional mobility. Baseline: -10 to 105 deg  Goal status: IN PROGRESS      PLAN: PT FREQUENCY: 2x/week  PT DURATION: 6 weeks  PLANNED INTERVENTIONS: Therapeutic exercises, Therapeutic activity, Neuromuscular re-education, Balance training, Gait training, Patient/Family education, Joint manipulation, Joint mobilization, Stair training, Orthotic/Fit training, DME instructions, Aquatic Therapy, Dry Needling, Electrical stimulation, Spinal manipulation, Spinal mobilization, Cryotherapy, Moist heat, Compression bandaging, scar mobilization, Splintting, Taping, Traction, Ultrasound, Ionotophoresis 86m/ml Dexamethasone, and Manual therapy   PLAN FOR NEXT SESSION: Promote knee extension/flexion ROM, edema management PRN, strengthening as tolerated  2:33 PM, 04/20/22 AMearl LatinPT, DPT Physical Therapist [facility]

## 2022-04-22 ENCOUNTER — Encounter (HOSPITAL_COMMUNITY): Payer: Medicare HMO

## 2022-04-24 ENCOUNTER — Ambulatory Visit (HOSPITAL_COMMUNITY): Payer: Medicare HMO

## 2022-04-24 DIAGNOSIS — R2689 Other abnormalities of gait and mobility: Secondary | ICD-10-CM | POA: Diagnosis not present

## 2022-04-24 DIAGNOSIS — R29898 Other symptoms and signs involving the musculoskeletal system: Secondary | ICD-10-CM | POA: Diagnosis not present

## 2022-04-24 DIAGNOSIS — M6281 Muscle weakness (generalized): Secondary | ICD-10-CM

## 2022-04-24 DIAGNOSIS — M25561 Pain in right knee: Secondary | ICD-10-CM | POA: Diagnosis not present

## 2022-04-24 DIAGNOSIS — M25661 Stiffness of right knee, not elsewhere classified: Secondary | ICD-10-CM

## 2022-04-24 NOTE — Therapy (Signed)
OUTPATIENT PHYSICAL THERAPY TREATMENT   Patient Name: Evelyn Le MRN: [id number] DOB:February 07, 1956, 66 y.o., female Today's Date: 04/24/2022     PT End of Session - 04/24/22 0951     Visit Number 10    Number of Visits 12    Date for PT Re-Evaluation 05/05/22    Authorization Type [employer] NO VL, no auth, medical necessity - follow Medicare guidelines    Progress Note Due on Visit 17    PT Start Time 0950    PT Stop Time 1029    PT Time Calculation (min) 39 min    Activity Tolerance Patient tolerated treatment well    Behavior During Therapy WFL for tasks assessed/performed               Past Medical History:  Diagnosis Date   Anxiety    Cancer ([place])    Depression    PONV (postoperative nausea and vomiting)    Past Surgical History:  Procedure Laterality Date   ABDOMINAL HYSTERECTOMY     APPENDECTOMY     HERNIA REPAIR     INCISIONAL HERNIA REPAIR  2010   REPLACEMENT TOTAL KNEE Left    TONSILLECTOMY     TOTAL KNEE ARTHROPLASTY Right 03/03/2022   Procedure: RIGHT TOTAL KNEE ARTHROPLASTY;  Surgeon: [name], MD;  Location: [place];  Service: Orthopedics;  Laterality: Right;  RNFA   Patient Active Problem List   Diagnosis Date Noted   Hospital discharge follow-up 03/06/2022   Status post right knee replacement 03/03/2022   Unilateral primary osteoarthritis, right knee 01/08/2022   Mixed hyperlipidemia 10/09/2021   Vitamin D deficiency 10/09/2021   Prediabetes 10/09/2021   Age-related osteoporosis without current pathological fracture 03/21/2021   Anxiety 11/08/2020   History of ovarian cancer 11/08/2020   History of total knee arthroplasty, left 11/08/2020   Postoperative incisional hernia 11/08/2020    PCP: Ihor Dow MD  REFERRING PROVIDER: [name], MD   REFERRING DIAG: [phone] (ICD-10-CM) - Status post right knee replacement   THERAPY DIAG:  Right knee pain, unspecified chronicity  Muscle weakness  (generalized)  Other abnormalities of gait and mobility  Stiffness of right knee, not elsewhere classified  Other symptoms and signs involving the musculoskeletal system  Rationale for Evaluation and Treatment Rehabilitation  ONSET DATE: 03/03/22  SUBJECTIVE:   SUBJECTIVE STATEMENT:  All of the sudden increased pain night of 7/3 and and a really bad day 7/4 and 7/5 with R knee pain; reports she felt she was going backwards; almost went to the ER because of the pain. A little better today but Still swollen.   PERTINENT HISTORY: Hx L TKA, HLD, osteoporosis, anxiety, hx ovarian cancer  PAIN:  Are you having pain? Yes 5/10 Right knee PRECAUTIONS: None   PATIENT GOALS to go up and down steps like normal, play with grandkids   OBJECTIVE:  Observation: healing surgical incision with steri strips intact, appears to be healing well  PATIENT SURVEYS:  FOTO 55% function  EDEMA:  Circumferential: R 43 cm    POSTURE: No Significant postural limitations  PALPATION: Slight TTP in R quad  LOWER EXTREMITY ROM:  Active ROM Right eval Left eval Right 03/25/22 Right  04/01/2022 Right  04/03/2022 Right  6/19 Right 04/13/22 Right 04/20/22  Hip flexion          Hip extension          Hip abduction          Hip adduction  Hip internal rotation          Hip external rotation          Knee flexion 88 128 94 101 103 105 105 104  Knee extension Lacking 27 0 Lacking 12 Lacking 14 Lacking 10 Lacking 9 -10 -10  Ankle dorsiflexion          Ankle plantarflexion          Ankle inversion          Ankle eversion           (Blank rows = not tested)  LOWER EXTREMITY MMT:  MMT Right eval Left eval  Hip flexion 5/5 5/5  Hip extension    Hip abduction    Hip adduction    Hip internal rotation    Hip external rotation    Knee flexion 5/5 5/5  Knee extension 4+/5 5/5  Ankle dorsiflexion 5/5 5/5  Ankle plantarflexion    Ankle inversion    Ankle eversion     (Blank rows = not  tested)   FUNCTIONAL TESTS:  5 times sit to stand: 14.22 seconds with heavy UE support and reliance on LLE  GAIT: Distance walked: 100 Assistive device utilized: Single point cane Level of assistance: Modified independence Comments: antalgic on RLE lacking TKE with limited knee flexion    TODAY'S TREATMENT: 04/24/22 Bike x  5 min; seat 12 for dynamic ward up and ROM Heel/toe raises 2 x 10 Slant board 5 x 20" Hamstring stretch on step 5 x 20" Knee drives on 12" step x 10  STM and manual knee flexion and extension x 9 min  -15 knee extension and 100 knee flexion at the end of treatment today      04/20/22 Bike 5 minutes for dynamic warm up and ROM Discussion of POC Prone knee flexion stretch 3x 20 second holds RLE Stairs 2RT 7 inch Squat x 5 with UE support      04/16/22  There-Act = Dynamic ROM and warmup on recumbent bike, full circles, cue for full extension x 8 mins   - Stairs up and down both sides in 8 rounds with cue for tall posture and slow control ecct.   - Sit to Stand x 20 reps x 2 sets with no hands and with cue for full ROM control  There-Ex =    - Bodycraft leg press, plate 4, 2 set x 10 reps, cue for max extension and distal quad activation   - Hell raises in // bars 1 x 10 reps flat, then 1 x 20 reps with small heel drop and cue for full knee ext   - Step up at stairs (8inch) x 20 reps R LE    - Lateral step up at stairs x 20 reps R LE   - Step down ecct control at stairs x 20 reps R LE   - Sitting hamstring stretch for knee extension with foot on 2nd step x 60 sec hold x 2 rounds    - Knee flexion stretch sitting on 2nd step, self lowering into flexion squat 2 x 60 sec hold   04/13/22 Reassess FOTO 73% STS x 5 Rec bike (seat 11) full circles  Heel raise x20 Calf stretch slant board 3 x 30" Step ups 6 inch 2 x 10 single HHA  Single leg balance 15" max bilateral  TKE  4 plates x 10 Stairs 7 inch 3 RT single rail, reciprocal    04/08/22  Bike  5' seat on 12  Standing:  Heel and toe raises 20X each   Knee flexion Rt AAROM stretch on 8" step 3X30" holds   Hamstring stretch Rt AAROM on 8" step 3X30"   Rt AROM knee flexion 15X   Rt knee lunge onto 4" step no UE assist 10X   Slant board stretch 3X30"   TKE AROM using bodycraft 3PL 10X5"  Supine:  Quad sets 15X5'   SAQ 15X5"  Manual: Retro massage for edema and scar massage to scar line/education   AROM today following manual -10-105                              04/06/22               Bike x 5' full revolution seat at 13               Standing:              Heel raises x 15              Toe raise x 10              Passive hamstring stretch on 12" box 3 x 30"              Knee flexion Rt knee on 12" box and lunge 3 x 30"              Terminal extension x 10              Standing knee flexion Rt x 10               Rocker board x 2'              Slant board x 3 30" hold               Single leg stance x 5              Supine:  quad set x 10                           SAQ x 10                           Active hamstring stretch x 3                            PROM for extension x 3                            Heel slides x 10              Prone:   terminal knee extension x 10               04/03/22               Bike x 5' full revolution seat at 14              Standing:              Heel raises x 15              Toe raise x 10              Passive hamstring  stretch on 12" box 3 x 30"              Knee flexion Rt knee on 12" box and lunge 3 x 30"              Terminal extension x 10              Standing knee flexion Rt x 10               Rocker board x 2'              Slant board x 3 30" hold               Single leg stance x 3               Supine:  quad set x 10                           SAQ x 10                           Patella mob                            PROM for extension x 3                            Heel slides x 10                            Active  hamstring stretch 3 x 30" with active pull down              04/01/22               Bike x 5' full revolution seat at 15              Standing:              Heel raises x 10              Passive hamstring stretch on 12" box 3 x 30"              Knee flexion Rt knee on 12" box and lunge 3 x 30"              Terminal extension x 10              Standing knee flexion Rt x 10               Rocker board x 2'              Slant board x 3 30" hold               Supine:  quad set x 10                           SAQ x 10                           Heel slides x 10                            Active hamstring stretch 3 x  30" with active pull down  03/25/22 Heel slide 10x 5-10 second holds with belt Quad set 10 x 10 second holds SLR R 2x 10  Supine hamstring isometric into green ball 90/90 position 10 x 10 second holds RLE Recumbent bike - rocking for ROM with 5 second holds at end range flexion HR 1x 15 TR 1x 15 Standing hip abduction 2x 10 bilateral  Mini squat 1x 10     03/24/22 Heel slide 10x 5-10 second holds with belt Quad set 10 x 10 second holds   PATIENT EDUCATION:  Education details: Patient educated on exam findings, POC, scope of PT, HEP, elevation for edema, not overdoing activity, and LE positioning at rest. 03/25/22 HEP, walking but not overdoing activity 04/16/22: ROM needs, stretch full extension  Person educated: Patient Education method: Explanation, Demonstration, and Handouts Education comprehension: verbalized understanding, returned demonstration, verbal cues required, and tactile cues required  HOME EXERCISE PROGRAM:              04/02/2011:  [id number] heel raise, active ham stretch and SAQ 6/6/23Access Code: [email or web address] - Supine Heel Slide with Strap (Mirrored)  - 3-5 x daily - 7 x weekly - 10 reps - 5-10 second hold - Supine Knee Extension Stretch on Towel Roll (Mirrored)  - 3 x daily - 7 x weekly - 15-20 minutes hold 03/25/22 - Long Sitting Quad Set (Mirrored)  - 3 x daily  - 7 x weekly - 10 reps - 10 second hold - Active Straight Leg Raise with Quad Set  - 3 x daily - 7 x weekly - 2 sets - 10 reps - Standing Heel Raises  - 3 x daily - 7 x weekly - 15 reps - Toe Raises with Counter Support  - 3 x daily - 7 x weekly - 1 sets - 15 reps - Standing Hip Abduction with Counter Support (Mirrored)  - 3 x daily - 7 x weekly - 2 sets - 10 reps - Mini Squat with Counter Support  - 3 x daily - 7 x weekly - 2 sets - 10 reps  ASSESSMENT:  CLINICAL IMPRESSION: Patient's cert end 6/96. She continues with significant extension lag and swelling in the knee.  Frustrated with set back this week. Needs verbal cues to improve heel strike with stance phase Right leg.  Resumed manual tissue work to day to improve knee flexion and extension; patient reports feeling looser; better at the end of treatment. Patient will continue to benefit from physical therapy in order to improve function and reduce impairment.   OBJECTIVE IMPAIRMENTS Abnormal gait, decreased activity tolerance, decreased balance, decreased endurance, decreased mobility, difficulty walking, decreased ROM, decreased strength, increased edema, impaired flexibility, improper body mechanics, and pain.   ACTIVITY LIMITATIONS lifting, bending, standing, squatting, sleeping, stairs, transfers, bed mobility, bathing, dressing, locomotion level, and caring for others  PARTICIPATION LIMITATIONS: meal prep, cleaning, laundry, driving, shopping, community activity, and yard work  PERSONAL FACTORS Time since onset of injury/illness/exacerbation and 3+ comorbidities: Anxiety, BMI increased, Hx Cancer, Osteoporosis  are also affecting patient's functional outcome.   REHAB POTENTIAL: Good  CLINICAL DECISION MAKING: Stable/uncomplicated  EVALUATION COMPLEXITY: Low   GOALS: Goals reviewed with patient? Yes  SHORT TERM GOALS: Target date: 04/14/2022  Patient will be independent with HEP in order to improve functional  outcomes. Baseline:  Goal status: MET  2.  Patient will report at least 25% improvement in symptoms for improved quality of life. Baseline:  Goal status: MET    LONG  TERM GOALS: Target date: 05/05/2022  Patient will report at least 75% improvement in symptoms for improved quality of life. Baseline: Reports 90%  Goal status: MET  2.  Patient will improve FOTO score by at least 25 points in order to indicate improved tolerance to activity. Baseline: 73% function Goal status: Ongoing   3.  Patient will be able to navigate stairs with reciprocal pattern without compensation in order to demonstrate improved LE strength. Baseline: Can do 7 inch step, reciprocal with single rail  Goal status: MET  4.  Patient will be able to complete 5x STS in under 11.4 seconds with equal weightbearing in order to demonstrate improved functional strength. Baseline: 11.04 sec with no UE  Goal status: MET  5.  Patient will improve ROM for R knee extension/flexion to lacking 5-120 degrees to improve squatting, and other functional mobility. Baseline: -10 to 105 deg  Goal status: IN PROGRESS      PLAN: PT FREQUENCY: 2x/week  PT DURATION: 6 weeks  PLANNED INTERVENTIONS: Therapeutic exercises, Therapeutic activity, Neuromuscular re-education, Balance training, Gait training, Patient/Family education, Joint manipulation, Joint mobilization, Stair training, Orthotic/Fit training, DME instructions, Aquatic Therapy, Dry Needling, Electrical stimulation, Spinal manipulation, Spinal mobilization, Cryotherapy, Moist heat, Compression bandaging, scar mobilization, Splintting, Taping, Traction, Ultrasound, Ionotophoresis 42m/ml Dexamethasone, and Manual therapy   PLAN FOR NEXT SESSION: Promote knee extension/flexion ROM, edema management PRN, strengthening as tolerated Patient's cert ends 74/76so reassess next visit.   11:18 AM, 04/24/22 Egor Fullilove Small Waylyn Tenbrink MPT [facility] [place]  #[phone]

## 2022-05-01 ENCOUNTER — Ambulatory Visit (HOSPITAL_COMMUNITY): Payer: Medicare HMO

## 2022-05-01 DIAGNOSIS — M25561 Pain in right knee: Secondary | ICD-10-CM

## 2022-05-01 DIAGNOSIS — R2689 Other abnormalities of gait and mobility: Secondary | ICD-10-CM | POA: Diagnosis not present

## 2022-05-01 DIAGNOSIS — M25661 Stiffness of right knee, not elsewhere classified: Secondary | ICD-10-CM | POA: Diagnosis not present

## 2022-05-01 DIAGNOSIS — M6281 Muscle weakness (generalized): Secondary | ICD-10-CM | POA: Diagnosis not present

## 2022-05-01 DIAGNOSIS — R29898 Other symptoms and signs involving the musculoskeletal system: Secondary | ICD-10-CM | POA: Diagnosis not present

## 2022-05-01 NOTE — Therapy (Signed)
OUTPATIENT PHYSICAL THERAPY DISCHARGE/PROGRESS NOTE  PHYSICAL THERAPY DISCHARGE SUMMARY  Visits from Start of Care: 11  Current functional level related to goals / functional outcomes: SEE BELOW   Remaining deficits: SEE BELOW   Education / Equipment: SEE BELOW   Patient agrees to discharge. Patient goals were partially met. Patient is being discharged due to being pleased with the current functional level.   Progress Note Reporting Period 03/24/2022 to 05/01/2022  See note below for Objective Data and Assessment of Progress/Goals.      Patient Name: Evelyn Le MRN: [id number] DOB:[birth year], 66 y.o., female Today's Date: 05/01/2022     PT End of Session - 05/01/22 1201     Visit Number 11    Number of Visits 12    Date for PT Re-Evaluation 05/05/22    Authorization Type [employer] NO VL, no auth, medical necessity - follow Medicare guidelines    Progress Note Due on Visit 17    PT Start Time 1200    PT Stop Time 1230    PT Time Calculation (min) 30 min    Activity Tolerance Patient tolerated treatment well    Behavior During Therapy WFL for tasks assessed/performed                Past Medical History:  Diagnosis Date   Anxiety    Cancer ([place])    Depression    PONV (postoperative nausea and vomiting)    Past Surgical History:  Procedure Laterality Date   ABDOMINAL HYSTERECTOMY     APPENDECTOMY     HERNIA REPAIR     INCISIONAL HERNIA REPAIR  2010   REPLACEMENT TOTAL KNEE Left    TONSILLECTOMY     TOTAL KNEE ARTHROPLASTY Right 03/03/2022   Procedure: RIGHT TOTAL KNEE ARTHROPLASTY;  Surgeon: [name], MD;  Location: [facility];  Service: Orthopedics;  Laterality: Right;  RNFA   Patient Active Problem List   Diagnosis Date Noted   Hospital discharge follow-up 03/06/2022   Status post right knee replacement 03/03/2022   Unilateral primary osteoarthritis, right knee 01/08/2022   Mixed hyperlipidemia 10/09/2021   Vitamin D  deficiency 10/09/2021   Prediabetes 10/09/2021   Age-related osteoporosis without current pathological fracture 03/21/2021   Anxiety 11/08/2020   History of ovarian cancer 11/08/2020   History of total knee arthroplasty, left 11/08/2020   Postoperative incisional hernia 11/08/2020    PCP: Ihor Dow MD  REFERRING PROVIDER: [name], MD   REFERRING DIAG: [phone] (ICD-10-CM) - Status post right knee replacement   THERAPY DIAG:  Stiffness of right knee, not elsewhere classified  Right knee pain, unspecified chronicity  Muscle weakness (generalized)  Other symptoms and signs involving the musculoskeletal system  Rationale for Evaluation and Treatment Rehabilitation  ONSET DATE: 03/03/22  SUBJECTIVE:   SUBJECTIVE STATEMENT:   Right knee much better this past week; some swelling with activity but not too bad; was able to fish, swim, get in and out of the hot tub and do some walking while on vacation. 95% better.  PERTINENT HISTORY: Hx L TKA, HLD, osteoporosis, anxiety, hx ovarian cancer  PAIN:  Are you having pain? Yes 0/10 Right knee PRECAUTIONS: None   PATIENT GOALS to go up and down steps like normal, play with grandkids   OBJECTIVE:  Observation: healing surgical incision with steri strips intact, appears to be healing well  PATIENT SURVEYS:  FOTO 55% function  EDEMA:  Circumferential: R 43 cm    POSTURE: No Significant postural limitations  PALPATION: Slight TTP in R quad  LOWER EXTREMITY ROM:  Active ROM Right eval Left eval Right 03/25/22 Right  04/01/2022 Right  04/03/2022 Right  6/19 Right 04/13/22 Right 04/20/22 Right 05/01/22  Hip flexion           Hip extension           Hip abduction           Hip adduction           Hip internal rotation           Hip external rotation           Knee flexion 88 128 94 101 103 105 105 104 112  Knee extension Lacking 27 0 Lacking 12 Lacking 14 Lacking 10 Lacking 9 -10 -10 -10  Ankle  dorsiflexion           Ankle plantarflexion           Ankle inversion           Ankle eversion            (Blank rows = not tested)  LOWER EXTREMITY MMT:  MMT Right eval Left eval Right 05/01/22  Hip flexion 5/5 5/5   Hip extension     Hip abduction     Hip adduction     Hip internal rotation     Hip external rotation     Knee flexion 5/5 5/5 5  Knee extension 4+/5 5/5 5  Ankle dorsiflexion 5/5 5/5   Ankle plantarflexion     Ankle inversion     Ankle eversion      (Blank rows = not tested)   FUNCTIONAL TESTS:  5 times sit to stand: 14.22 seconds with heavy UE support and reliance on LLE  GAIT: Distance walked: 100 Assistive device utilized: Single point cane Level of assistance: Modified independence Comments: antalgic on RLE lacking TKE with limited knee flexion    TODAY'S TREATMENT: 05/01/22 STM and PROM to right knee in supine Bike seat 12 x 5' for ROM  Right knee AROM -10 to 112  FOTO 74   04/24/22 Bike x  5 min; seat 12 for dynamic ward up and ROM Heel/toe raises 2 x 10 Slant board 5 x 20" Hamstring stretch on step 5 x 20" Knee drives on 12" step x 10  STM and manual knee flexion and extension x 9 min  -15 knee extension and 100 knee flexion at the end of treatment today      04/20/22 Bike 5 minutes for dynamic warm up and ROM Discussion of POC Prone knee flexion stretch 3x 20 second holds RLE Stairs 2RT 7 inch Squat x 5 with UE support      04/16/22  There-Act = Dynamic ROM and warmup on recumbent bike, full circles, cue for full extension x 8 mins   - Stairs up and down both sides in 8 rounds with cue for tall posture and slow control ecct.   - Sit to Stand x 20 reps x 2 sets with no hands and with cue for full ROM control  There-Ex =    - Bodycraft leg press, plate 4, 2 set x 10 reps, cue for max extension and distal quad activation   - Hell raises in // bars 1 x 10 reps flat, then 1 x 20 reps with small heel drop and cue for full knee  ext   - Step up at stairs (8inch) x 20 reps R LE    -  Lateral step up at stairs x 20 reps R LE   - Step down ecct control at stairs x 20 reps R LE   - Sitting hamstring stretch for knee extension with foot on 2nd step x 60 sec hold x 2 rounds    - Knee flexion stretch sitting on 2nd step, self lowering into flexion squat 2 x 60 sec hold   04/13/22 Reassess FOTO 73% STS x 5 Rec bike (seat 11) full circles  Heel raise x20 Calf stretch slant board 3 x 30" Step ups 6 inch 2 x 10 single HHA  Single leg balance 15" max bilateral  TKE  4 plates x 10 Stairs 7 inch 3 RT single rail, reciprocal    04/08/22  Bike 5' seat on 12  Standing:  Heel and toe raises 20X each   Knee flexion Rt AAROM stretch on 8" step 3X30" holds   Hamstring stretch Rt AAROM on 8" step 3X30"   Rt AROM knee flexion 15X   Rt knee lunge onto 4" step no UE assist 10X   Slant board stretch 3X30"   TKE AROM using bodycraft 3PL 10X5"  Supine:  Quad sets 15X5'   SAQ 15X5"  Manual: Retro massage for edema and scar massage to scar line/education   AROM today following manual -10-105                              04/06/22               Bike x 5' full revolution seat at 13               Standing:              Heel raises x 15              Toe raise x 10              Passive hamstring stretch on 12" box 3 x 30"              Knee flexion Rt knee on 12" box and lunge 3 x 30"              Terminal extension x 10              Standing knee flexion Rt x 10               Rocker board x 2'              Slant board x 3 30" hold               Single leg stance x 5              Supine:  quad set x 10                           SAQ x 10                           Active hamstring stretch x 3                            PROM for extension x 3  Heel slides x 10              Prone:   terminal knee extension x 10               04/03/22               Bike x 5' full revolution seat at 14               Standing:              Heel raises x 15              Toe raise x 10              Passive hamstring stretch on 12" box 3 x 30"              Knee flexion Rt knee on 12" box and lunge 3 x 30"              Terminal extension x 10              Standing knee flexion Rt x 10               Rocker board x 2'              Slant board x 3 30" hold               Single leg stance x 3               Supine:  quad set x 10                           SAQ x 10                           Patella mob                            PROM for extension x 3                            Heel slides x 10                            Active hamstring stretch 3 x 30" with active pull down              04/01/22               Bike x 5' full revolution seat at 15              Standing:              Heel raises x 10              Passive hamstring stretch on 12" box 3 x 30"              Knee flexion Rt knee on 12" box and lunge 3 x 30"              Terminal extension x 10              Standing knee flexion Rt x 10               Rocker board x 2'  Slant board x 3 30" hold               Supine:  quad set x 10                           SAQ x 10                           Heel slides x 10                            Active hamstring stretch 3 x 30" with active pull down  03/25/22 Heel slide 10x 5-10 second holds with belt Quad set 10 x 10 second holds SLR R 2x 10  Supine hamstring isometric into green ball 90/90 position 10 x 10 second holds RLE Recumbent bike - rocking for ROM with 5 second holds at end range flexion HR 1x 15 TR 1x 15 Standing hip abduction 2x 10 bilateral  Mini squat 1x 10     03/24/22 Heel slide 10x 5-10 second holds with belt Quad set 10 x 10 second holds   PATIENT EDUCATION:  Education details: Patient educated on exam findings, POC, scope of PT, HEP, elevation for edema, not overdoing activity, and LE positioning at rest. 03/25/22 HEP, walking but not overdoing activity 04/16/22: ROM  needs, stretch full extension  Person educated: Patient Education method: Explanation, Demonstration, and Handouts Education comprehension: verbalized understanding, returned demonstration, verbal cues required, and tactile cues required  HOME EXERCISE PROGRAM:              04/02/2011:  [id number] heel raise, active ham stretch and SAQ 6/6/23Access Code: [email or web address] - Supine Heel Slide with Strap (Mirrored)  - 3-5 x daily - 7 x weekly - 10 reps - 5-10 second hold - Supine Knee Extension Stretch on Towel Roll (Mirrored)  - 3 x daily - 7 x weekly - 15-20 minutes hold 03/25/22 - Long Sitting Quad Set (Mirrored)  - 3 x daily - 7 x weekly - 10 reps - 10 second hold - Active Straight Leg Raise with Quad Set  - 3 x daily - 7 x weekly - 2 sets - 10 reps - Standing Heel Raises  - 3 x daily - 7 x weekly - 15 reps - Toe Raises with Counter Support  - 3 x daily - 7 x weekly - 1 sets - 15 reps - Standing Hip Abduction with Counter Support (Mirrored)  - 3 x daily - 7 x weekly - 2 sets - 10 reps - Mini Squat with Counter Support  - 3 x daily - 7 x weekly - 2 sets - 10 reps  ASSESSMENT:  CLINICAL IMPRESSION: Patient's cert end 3/90. She continues with  extension lag but overall good improvement in the knee and patient is satisfied with her functional status. Discussed importance of continued compliance with HEP especially with prone hang.  Patient in agreement with discharge plan.  FOTO improved score but low scored items due to patient not running.   OBJECTIVE IMPAIRMENTS Abnormal gait, decreased activity tolerance, decreased balance, decreased endurance, decreased mobility, difficulty walking, decreased ROM, decreased strength, increased edema, impaired flexibility, improper body mechanics, and pain.   ACTIVITY LIMITATIONS lifting, bending, standing, squatting, sleeping, stairs, transfers, bed mobility, bathing, dressing, locomotion level, and caring for others  PARTICIPATION LIMITATIONS: meal prep, cleaning,  laundry, driving, shopping, community activity,  and yard work  PERSONAL FACTORS Time since onset of injury/illness/exacerbation and 3+ comorbidities: Anxiety, BMI increased, Hx Cancer, Osteoporosis  are also affecting patient's functional outcome.   REHAB POTENTIAL: Good  CLINICAL DECISION MAKING: Stable/uncomplicated  EVALUATION COMPLEXITY: Low   GOALS: Goals reviewed with patient? Yes  SHORT TERM GOALS: Target date: 04/14/2022  Patient will be independent with HEP in order to improve functional outcomes. Baseline:  Goal status: MET  2.  Patient will report at least 25% improvement in symptoms for improved quality of life. Baseline:  Goal status: MET    LONG TERM GOALS: Target date: 05/05/2022  Patient will report at least 75% improvement in symptoms for improved quality of life. Baseline: Reports 90%  Goal status: MET  2.  Patient will improve FOTO score by at least 25 points in order to indicate improved tolerance to activity. Baseline: 73% function; 74 Goal status: partially met  3.  Patient will be able to navigate stairs with reciprocal pattern without compensation in order to demonstrate improved LE strength. Baseline: Can do 7 inch step, reciprocal with single rail  Goal status: MET  4.  Patient will be able to complete 5x STS in under 11.4 seconds with equal weightbearing in order to demonstrate improved functional strength. Baseline: 11.04 sec with no UE  Goal status: MET  5.  Patient will improve ROM for R knee extension/flexion to lacking 5-120 degrees to improve squatting, and other functional mobility. Baseline: -10 to 105 deg , -10 to 112 Goal status: partially met      PLAN: PT FREQUENCY: 2x/week  PT DURATION: 6 weeks  PLANNED INTERVENTIONS: Therapeutic exercises, Therapeutic activity, Neuromuscular re-education, Balance training, Gait training, Patient/Family education, Joint manipulation, Joint mobilization, Stair training, Orthotic/Fit  training, DME instructions, Aquatic Therapy, Dry Needling, Electrical stimulation, Spinal manipulation, Spinal mobilization, Cryotherapy, Moist heat, Compression bandaging, scar mobilization, Splintting, Taping, Traction, Ultrasound, Ionotophoresis 4mg /ml Dexamethasone, and Manual therapy   PLAN FOR NEXT SESSION: discharge  12:31 PM, 05/01/22 Darrelle Barrell Small Aseel Uhde MPT [facility] [place] [phone] [phone]

## 2022-05-14 ENCOUNTER — Ambulatory Visit (INDEPENDENT_AMBULATORY_CARE_PROVIDER_SITE_OTHER): Payer: Medicare HMO

## 2022-05-14 ENCOUNTER — Encounter: Payer: Self-pay | Admitting: Orthopaedic Surgery

## 2022-05-14 ENCOUNTER — Ambulatory Visit: Payer: Medicare HMO | Admitting: Orthopaedic Surgery

## 2022-05-14 DIAGNOSIS — Z96651 Presence of right artificial knee joint: Secondary | ICD-10-CM | POA: Diagnosis not present

## 2022-05-14 NOTE — Progress Notes (Signed)
The patient is now about 9 weeks status post a right total knee arthroplasty.  She has been released by physical therapy.  She reports that she is doing well.  She does like full extension by about 3 to 5 degrees and I can flex her to about 110 degrees.  She feels like she can push beyond that.  She does have a history of a left knee replacement done 2 years ago that has full range of motion.  Her right knee does have swelling at 9 weeks to be expected.  Her calf is soft.  2 views of the right knee show well-seated total knee arthroplasty with no complicating features.  She will continue to push herself with motion with no restrictions.  We will see her back in 4 months which will be the 41-monthstandpoint from surgery.  No x-rays are needed at that standpoint.

## 2022-06-23 ENCOUNTER — Other Ambulatory Visit: Payer: Self-pay | Admitting: Nurse Practitioner

## 2022-06-23 ENCOUNTER — Ambulatory Visit (INDEPENDENT_AMBULATORY_CARE_PROVIDER_SITE_OTHER): Payer: Medicare HMO | Admitting: Nurse Practitioner

## 2022-06-23 ENCOUNTER — Encounter: Payer: Self-pay | Admitting: Nurse Practitioner

## 2022-06-23 DIAGNOSIS — L237 Allergic contact dermatitis due to plants, except food: Secondary | ICD-10-CM | POA: Diagnosis not present

## 2022-06-23 MED ORDER — HYDROXYZINE HCL 25 MG PO TABS
25.0000 mg | ORAL_TABLET | Freq: Three times a day (TID) | ORAL | 0 refills | Status: DC | PRN
Start: 2022-06-23 — End: 2023-04-23

## 2022-06-23 MED ORDER — PREDNISONE 20 MG PO TABS: ORAL_TABLET | ORAL | 0 refills | Status: DC

## 2022-06-23 NOTE — Assessment & Plan Note (Addendum)
Start prednisone 60 mg by mouth daily for 7 days, take med with meals. Hydroxyzine 25 mg 3 times daily as needed Patient encouraged to avoid scratching the rashes to help prevent infection, oatmeal bath and cold compress encouraged

## 2022-06-23 NOTE — Patient Instructions (Signed)
Please take prednisone '60mg'$  daily for 7 days , vistaril '25mg'$  three times daily as needed for itching.     Thanks for choosing [facility], we consider it a privelige to serve you.

## 2022-06-23 NOTE — Progress Notes (Signed)
   Evelyn Le     MRN: [id number]      DOB: [birth year]   HPI Evelyn Le with past medical history of osteoarthritis, mixed hyperlipidemia, vitamin D deficiency is here for complaints of poison oak rashes that started 4 days ago. [place] ,4 [place]  she noticed red itchy rashes and swelling around her eyes , neck and chest  she has been using benadry cream but no improvement .denies fever , chills. States that she has been exposed to poison oak in the past .        ROS Denies recent fever or chills. Denies sinus pressure, nasal congestion, ear pain or sore throat. Denies chest congestion, productive cough or wheezing. Denies chest pains, palpitations and leg swelling Denies abdominal pain, nausea, vomiting,diarrhea or constipation.   Denies headaches, seizures, numbness, or tingling. Denies depression, anxiety or insomnia.   PE  BP 116/81 (BP Location: Right Arm, Patient Position: Sitting, Cuff Size: Large)   Pulse 83   Ht '5\' 3"'$  (1.6 m)   Wt 178 lb (80.7 kg)   SpO2 97%   BMI 31.53 kg/m   Patient alert and oriented and in no cardiopulmonary distress.  HEENT: No facial asymmetry, EOMI,     Neck supple .  Chest: Clear to auscultation bilaterally.  CVS: S1, S2 no murmurs, no S3.Regular rate.  Skin: erythredematous rashes noted below both eyes, neck area and chest . Rashes are non fluid filled. Affected Skin below the eyes appears puffy .   Psych: Good eye contact, normal affect. Memory intact not anxious or depressed appearing.  CNS: CN 2-12 intact, power,  normal throughout.no focal deficits noted.   Assessment & Plan  Poison ivy dermatitis Start prednisone 60 mg by mouth daily for 7 days, take med with meals. Hydroxyzine 25 mg 3 times daily as needed Patient encouraged to avoid scratching the rashes to help prevent infection, oatmeal bath and cold compress encouraged

## 2022-09-17 ENCOUNTER — Ambulatory Visit: Payer: Medicare HMO | Admitting: Orthopaedic Surgery

## 2022-09-23 ENCOUNTER — Ambulatory Visit: Payer: Medicare HMO | Admitting: Orthopaedic Surgery

## 2022-10-16 ENCOUNTER — Other Ambulatory Visit: Payer: Self-pay | Admitting: Internal Medicine

## 2022-10-16 DIAGNOSIS — F419 Anxiety disorder, unspecified: Secondary | ICD-10-CM

## 2022-10-22 ENCOUNTER — Ambulatory Visit: Payer: Medicare HMO | Admitting: Orthopaedic Surgery

## 2022-10-22 ENCOUNTER — Encounter: Payer: Self-pay | Admitting: Internal Medicine

## 2022-10-22 ENCOUNTER — Encounter: Payer: Self-pay | Admitting: Orthopaedic Surgery

## 2022-10-22 ENCOUNTER — Ambulatory Visit (INDEPENDENT_AMBULATORY_CARE_PROVIDER_SITE_OTHER): Payer: Medicare HMO | Admitting: Internal Medicine

## 2022-10-22 ENCOUNTER — Other Ambulatory Visit: Payer: Self-pay

## 2022-10-22 VITALS — BP 90/57 | HR 94 | Ht 63.0 in | Wt 175.6 lb

## 2022-10-22 DIAGNOSIS — E782 Mixed hyperlipidemia: Secondary | ICD-10-CM

## 2022-10-22 DIAGNOSIS — M170 Bilateral primary osteoarthritis of knee: Secondary | ICD-10-CM | POA: Diagnosis not present

## 2022-10-22 DIAGNOSIS — Z96651 Presence of right artificial knee joint: Secondary | ICD-10-CM

## 2022-10-22 DIAGNOSIS — Z5320 Procedure and treatment not carried out because of patient's decision for unspecified reasons: Secondary | ICD-10-CM

## 2022-10-22 DIAGNOSIS — E559 Vitamin D deficiency, unspecified: Secondary | ICD-10-CM

## 2022-10-22 DIAGNOSIS — Z8543 Personal history of malignant neoplasm of ovary: Secondary | ICD-10-CM

## 2022-10-22 DIAGNOSIS — R739 Hyperglycemia, unspecified: Secondary | ICD-10-CM

## 2022-10-22 DIAGNOSIS — F419 Anxiety disorder, unspecified: Secondary | ICD-10-CM

## 2022-10-22 DIAGNOSIS — M810 Age-related osteoporosis without current pathological fracture: Secondary | ICD-10-CM | POA: Diagnosis not present

## 2022-10-22 DIAGNOSIS — Z0001 Encounter for general adult medical examination with abnormal findings: Secondary | ICD-10-CM

## 2022-10-22 DIAGNOSIS — R69 Illness, unspecified: Secondary | ICD-10-CM | POA: Diagnosis not present

## 2022-10-22 MED ORDER — ESCITALOPRAM OXALATE 20 MG PO TABS: 20.0000 mg | ORAL_TABLET | Freq: Every day | ORAL | 3 refills | Status: DC

## 2022-10-22 MED ORDER — LORAZEPAM 0.5 MG PO TABS: 0.5000 mg | ORAL_TABLET | Freq: Every evening | ORAL | 1 refills | Status: DC | PRN

## 2022-10-22 NOTE — Assessment & Plan Note (Addendum)
S/p b/l TKA Completed physical therapy Has right knee pain and swelling, followed by orthopedic surgery Takes Tylenol as needed Advised to use Voltaren as needed

## 2022-10-22 NOTE — Progress Notes (Signed)
The patient is now 7 months status post a right total knee arthroplasty.  She says she is not really happy with her knee in terms of the pain and clicking and lack of full motion of the knee.  She is really not taking any pain medications and she seems to be doing well from my standpoint.  I did show her knee replacement model and showed her while her knees can click after this type of surgery given the components themselves.  On exam her right knee lacks full extension by about 5 degrees but can almost fully flex.  It feels ligamentously stable to me.  I would like to set her up for outpatient physical therapy to really work on range of motion and strengthening of her right knee.  I would then see her back in 3 months with a standing AP and lateral of her right knee.  Hopefully she will make improvements in eventually be pleased that she had this done.

## 2022-10-22 NOTE — Assessment & Plan Note (Addendum)
Overall well-controlled with Lexapro and Ativan Has been on Ativan for about 10 years, takes about 2-3 times in a week

## 2022-10-22 NOTE — Assessment & Plan Note (Signed)
Physical exam as documented. Fasting blood tests ordered. Denies mammography and colon cancer screening.

## 2022-10-22 NOTE — Assessment & Plan Note (Signed)
Lipid profile reviewed Advised to follow low cholesterol diet for now

## 2022-10-22 NOTE — Assessment & Plan Note (Addendum)
Last vitamin D Lab Results  Component Value Date   VD25OH 21.5 (L) 03/20/2021   Advised to take Caltrate plus D3 twice daily

## 2022-10-22 NOTE — Assessment & Plan Note (Signed)
Around 2010 S/p hysterectomy and ovarian resection Had metastatic disease according to history Had chemotherapy for 1 year

## 2022-10-22 NOTE — Patient Instructions (Addendum)
Please start taking Caltrate 600 + D3 twice daily.  Please continue taking medications as prescribed.  Please continue to follow heart healthy diet and ambulate as tolerated.
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
## 2022-10-22 NOTE — Assessment & Plan Note (Signed)
Did not tolerate Fosamax, had tremors, balance problems and recurrent falls while taking it Continue calcium and Vitamin D supplements

## 2022-11-05 ENCOUNTER — Ambulatory Visit (HOSPITAL_COMMUNITY): Payer: Medicare HMO

## 2022-11-18 ENCOUNTER — Encounter: Payer: Self-pay | Admitting: Internal Medicine

## 2022-11-18 ENCOUNTER — Ambulatory Visit (INDEPENDENT_AMBULATORY_CARE_PROVIDER_SITE_OTHER): Payer: Medicare HMO | Admitting: Internal Medicine

## 2022-11-18 DIAGNOSIS — Z0000 Encounter for general adult medical examination without abnormal findings: Secondary | ICD-10-CM

## 2022-11-18 NOTE — Progress Notes (Signed)
Subjective:   Joan Herschberger is a 67 y.o. female who presents for Medicare Annual (Subsequent) preventive examination.  Review of Systems    Review of Systems  All other systems reviewed and are negative.   Objective:    There were no vitals filed for this visit. There is no height or weight on file to calculate BMI.     11/18/2022    9:26 AM 03/24/2022   10:17 AM 03/03/2022   11:44 AM 02/20/2022   10:05 AM 11/17/2021   10:49 AM 11/15/2020    8:12 AM  Advanced Directives  Does Patient Have a Medical Advance Directive? No No No No No No  Would patient like information on creating a medical advance directive? No - Patient declined No - Patient declined No - Patient declined No - Patient declined Yes (ED - Information included in AVS) No - Patient declined    Current Medications (verified) Outpatient Encounter Medications as of 11/18/2022  Medication Sig   acetaminophen (TYLENOL) 500 MG tablet Take 1,000 mg by mouth every 6 (six) hours as needed (pain.).   aspirin 81 MG chewable tablet Chew 1 tablet (81 mg total) by mouth 2 (two) times daily. (Patient not taking: Reported on 06/23/2022)   escitalopram (LEXAPRO) 20 MG tablet Take 1 tablet (20 mg total) by mouth daily.   gabapentin (NEURONTIN) 300 MG capsule Take 1 capsule (300 mg total) by mouth at bedtime. (Patient not taking: Reported on 06/23/2022)   hydrOXYzine (ATARAX) 25 MG tablet Take 1 tablet (25 mg total) by mouth every 8 (eight) hours as needed.   LORazepam (ATIVAN) 0.5 MG tablet Take 1 tablet (0.5 mg total) by mouth at bedtime as needed for anxiety.   methocarbamol (ROBAXIN) 500 MG tablet Take 1 tablet (500 mg total) by mouth every 6 (six) hours as needed for muscle spasms. (Patient not taking: Reported on 06/23/2022)   ondansetron (ZOFRAN-ODT) 4 MG disintegrating tablet Take 1 tablet (4 mg total) by mouth every 8 (eight) hours as needed for nausea or vomiting. (Patient not taking: Reported on 06/23/2022)   No  facility-administered encounter medications on file as of 11/18/2022.    Allergies (verified) Fosamax [alendronate] and Penicillins   History: Past Medical History:  Diagnosis Date   Anxiety    Cancer ([place])    Depression    PONV (postoperative nausea and vomiting)    Past Surgical History:  Procedure Laterality Date   ABDOMINAL HYSTERECTOMY     APPENDECTOMY     HERNIA REPAIR     INCISIONAL HERNIA REPAIR  2010   REPLACEMENT TOTAL KNEE Left    TONSILLECTOMY     TOTAL KNEE ARTHROPLASTY Right 03/03/2022   Procedure: RIGHT TOTAL KNEE ARTHROPLASTY;  Surgeon: [name], MD;  Location: [place];  Service: Orthopedics;  Laterality: Right;  RNFA   No family history on file. Social History   Socioeconomic History   Marital status: Married    Spouse name: Not on file   Number of children: Not on file   Years of education: Not on file   Highest education level: Not on file  Occupational History   Not on file  Tobacco Use   Smoking status: Never   Smokeless tobacco: Never  Vaping Use   Vaping Use: Never used  Substance and Sexual Activity   Alcohol use: Not Currently   Drug use: Never   Sexual activity: Not on file  Other Topics Concern   Not on file  Social History Narrative  Not on file   Social Determinants of Health   Financial Resource Strain: Low Risk  (11/17/2021)   Overall Financial Resource Strain (CARDIA)    Difficulty of Paying Living Expenses: Not hard at all  Food Insecurity: No Food Insecurity (11/17/2021)   Hunger Vital Sign    Worried About Running Out of Food in the Last Year: Never true    Ran Out of Food in the Last Year: Never true  Transportation Needs: No Transportation Needs (11/17/2021)   PRAPARE - Hydrologist (Medical): No    Lack of Transportation (Non-Medical): No  Physical Activity: Sufficiently Active (11/17/2021)   Exercise Vital Sign    Days of Exercise per Week: 5 days    Minutes of Exercise per  Session: 40 min  Stress: No Stress Concern Present (11/15/2020)   [place]    Feeling of Stress : Not at all  Social Connections: Moderately Integrated (11/17/2021)   Social Connection and Isolation Panel [NHANES]    Frequency of Communication with Friends and Family: More than three times a week    Frequency of Social Gatherings with Friends and Family: More than three times a week    Attends Religious Services: More than 4 times per year    Active Member of [employer] or Organizations: No    Attends Music therapist: Never    Marital Status: Married    Tobacco Counseling Counseling given: Not Answered   Clinical Intake:  Pre-visit preparation completed: Yes  Pain : No/denies pain     Diabetes: No  How often do you need to have someone help you when you read instructions, pamphlets, or other written materials from your doctor or pharmacy?: 1 - Never What is the last grade level you completed in school?: 4th year of college  Diabetic?No    Activities of Daily Living    03/03/2022    9:00 PM 02/20/2022   10:10 AM  In your present state of health, do you have any difficulty performing the following activities:  Hearing? 0   Vision? 0   Difficulty concentrating or making decisions? 0   Walking or climbing stairs? 1   Dressing or bathing? 0   Doing errands, shopping? 0 0    Patient Care Team: Lindell Spar, MD as PCP - General (Internal Medicine)  Indicate any recent Medical Services you may have received from other than [facility] providers in the past year (date may be approximate).     Assessment:   This is a routine wellness examination for [place].  Hearing/Vision screen No results found.  Dietary issues and exercise activities discussed:     Goals Addressed   None    Depression Screen    10/22/2022    8:34 AM 06/23/2022   10:23 AM 03/06/2022   10:09 AM 10/09/2021    2:32 PM  03/14/2021    8:11 AM 11/15/2020    8:10 AM 11/08/2020    8:14 AM  PHQ 2/9 Scores  PHQ - 2 Score 0 0 0 0 0 0 0    Fall Risk    10/22/2022    8:34 AM 06/23/2022   10:23 AM 03/06/2022   10:09 AM 11/17/2021   10:49 AM 10/09/2021    2:33 PM  Fall Risk   Falls in the past year? 1 1 0 1 1  Number falls in past yr: 1 1 0 0 1  Injury with Fall?  0 0 0 0 0  Risk for fall due to :  History of fall(s) No Fall Risks  History of fall(s);Impaired mobility  Follow up  Falls evaluation completed Falls evaluation completed  Falls evaluation completed;Falls prevention discussed    FALL RISK PREVENTION PERTAINING TO THE HOME:  Any stairs in or around the home? No  If so, are there any without handrails? No  Home free of loose throw rugs in walkways, pet beds, electrical cords, etc? Yes  Adequate lighting in your home to reduce risk of falls? Yes   ASSISTIVE DEVICES UTILIZED TO PREVENT FALLS:  Life alert? No  Use of a cane, walker or w/c? No  Grab bars in the bathroom? Yes  Shower chair or bench in shower? No  Elevated toilet seat or a handicapped toilet? Yes    Cognitive Function:        11/17/2021   11:00 AM 11/15/2020    8:13 AM  6CIT Screen  What Year? 0 points 0 points  What month? 0 points 0 points  What time? 0 points 0 points  Count back from 20 0 points 0 points  Months in reverse 0 points 0 points  Repeat phrase 0 points   Total Score 0 points     Immunizations Immunization History  Administered Date(s) Administered   Fluad Quad(high Dose 65+) 07/29/2022   Moderna Sars-Covid-2 Vaccination 01/17/2020, 02/14/2020, 09/18/2020   PNEUMOCOCCAL CONJUGATE-20 10/09/2021   Zoster Recombinat (Shingrix) 05/19/2022, 07/19/2022    TDAP status: Due, Education has been provided regarding the importance of this vaccine. Advised may receive this vaccine at local pharmacy or Health Dept. Aware to provide a copy of the vaccination record if obtained from local pharmacy or Health Dept.  Verbalized acceptance and understanding.  Flu Vaccine status: Up to date  Pneumococcal vaccine status: Up to date  Covid-19 vaccine status: Declined, Education has been provided regarding the importance of this vaccine but patient still declined. Advised may receive this vaccine at local pharmacy or Health Dept.or vaccine clinic. Aware to provide a copy of the vaccination record if obtained from local pharmacy or Health Dept. Verbalized acceptance and understanding.  Qualifies for Shingles Vaccine? Yes   Zostavax completed No   Shingrix Completed?: Yes  Screening Tests Health Maintenance  Topic Date Due   DTaP/Tdap/Td (1 - Tdap) Never done   COLONOSCOPY (Pts 45-55yr Insurance coverage will need to be confirmed)  Never done   MAMMOGRAM  Never done   Medicare Annual Wellness (AWV)  11/17/2022   Pneumonia Vaccine 67 Years old  Completed   INFLUENZA VACCINE  Completed   DEXA SCAN  Completed   Hepatitis C Screening  Completed   Zoster Vaccines- Shingrix  Completed   HPV VACCINES  Aged Out   COVID-19 Vaccine  Discontinued    Health Maintenance  Health Maintenance Due  Topic Date Due   DTaP/Tdap/Td (1 - Tdap) Never done   COLONOSCOPY (Pts 45-494yrInsurance coverage will need to be confirmed)  Never done   MAMMOGRAM  Never done   Medicare Annual Wellness (AWV)  11/17/2022    Colorectal cancer screening: Declined  Mammogram status: Declined  Bone Density status: Completed 03/21/2021. Results reflect: Bone density results: OSTEOPOROSIS. Repeat every 2 years.  Lung Cancer Screening: (Low Dose CT Chest recommended if Age 596-80ears, 30 pack-year currently smoking OR have quit w/in 15years.) does not qualify.   Additional Screening:  Hepatitis C Screening: does not qualify; Completed 03/20/2021  Vision Screening: Recommended annual ophthalmology  exams for early detection of glaucoma and other disorders of the eye. Is the patient up to date with their annual eye exam?  No  Who  is the provider or what is the name of the office in which the patient attends annual eye exams? Not established If pt is not established with a provider, would they like to be referred to a provider to establish care? No .   Dental Screening: Recommended annual dental exams for proper oral hygiene  Community Resource Referral / Chronic Care Management: CRR required this visit?  No   CCM required this visit?  No      Plan:     I have personally reviewed and noted the following in the patient's chart:   Medical and social history Use of alcohol, tobacco or illicit drugs  Current medications and supplements including opioid prescriptions. Patient is not currently taking opioid prescriptions. Functional ability and status Nutritional status Physical activity Advanced directives List of other physicians Hospitalizations, surgeries, and ER visits in previous 12 months Vitals Screenings to include cognitive, depression, and falls Referrals and appointments  In addition, I have reviewed and discussed with patient certain preventive protocols, quality metrics, and best practice recommendations. A written personalized care plan for preventive services as well as general preventive health recommendations were provided to patient.     Lorene Dy, MD   11/18/2022

## 2022-11-18 NOTE — Patient Instructions (Addendum)
  Evelyn Le , Thank you for taking time to come for your Medicare Wellness Visit. I appreciate your ongoing commitment to your health goals. Please review the following plan we discussed and let me know if I can assist you in the future.   These are the goals we discussed: No goals at this time.   This is a list of the screening recommended for you and due dates:  Health Maintenance  Topic Date Due   DTaP/Tdap/Td vaccine (1 - Tdap) Never done   Colon Cancer Screening  Never done   Mammogram  Never done   Medicare Annual Wellness Visit  11/17/2022   Pneumonia Vaccine  Completed   Flu Shot  Completed   DEXA scan (bone density measurement)  Completed   Hepatitis C Screening: USPSTF Recommendation to screen - Ages 65-79 yo.  Completed   Zoster (Shingles) Vaccine  Completed   HPV Vaccine  Aged Out   COVID-19 Vaccine  Discontinued

## 2022-12-18 DIAGNOSIS — R69 Illness, unspecified: Secondary | ICD-10-CM | POA: Diagnosis not present

## 2022-12-18 DIAGNOSIS — E559 Vitamin D deficiency, unspecified: Secondary | ICD-10-CM | POA: Diagnosis not present

## 2022-12-18 DIAGNOSIS — F419 Anxiety disorder, unspecified: Secondary | ICD-10-CM | POA: Diagnosis not present

## 2022-12-18 DIAGNOSIS — Z8543 Personal history of malignant neoplasm of ovary: Secondary | ICD-10-CM | POA: Diagnosis not present

## 2022-12-18 DIAGNOSIS — E782 Mixed hyperlipidemia: Secondary | ICD-10-CM | POA: Diagnosis not present

## 2022-12-18 DIAGNOSIS — Z0001 Encounter for general adult medical examination with abnormal findings: Secondary | ICD-10-CM | POA: Diagnosis not present

## 2022-12-18 DIAGNOSIS — R739 Hyperglycemia, unspecified: Secondary | ICD-10-CM | POA: Diagnosis not present

## 2022-12-19 LAB — CMP14+EGFR
ALT: 8 IU/L (ref 0–32)
AST: 16 IU/L (ref 0–40)
Albumin/Globulin Ratio: 2.3 — ABNORMAL HIGH (ref 1.2–2.2)
Albumin: 4.5 g/dL (ref 3.9–4.9)
Alkaline Phosphatase: 92 IU/L (ref 44–121)
BUN/Creatinine Ratio: 19 (ref 12–28)
BUN: 15 mg/dL (ref 8–27)
Bilirubin Total: 0.5 mg/dL (ref 0.0–1.2)
CO2: 20 mmol/L (ref 20–29)
Calcium: 9.5 mg/dL (ref 8.7–10.3)
Chloride: 105 mmol/L (ref 96–106)
Creatinine, Ser: 0.81 mg/dL (ref 0.57–1.00)
Globulin, Total: 2 g/dL (ref 1.5–4.5)
Glucose: 108 mg/dL — ABNORMAL HIGH (ref 70–99)
Potassium: 5.2 mmol/L (ref 3.5–5.2)
Sodium: 141 mmol/L (ref 134–144)
Total Protein: 6.5 g/dL (ref 6.0–8.5)
eGFR: 79 mL/min/{1.73_m2} (ref >59)

## 2022-12-19 LAB — CBC WITH DIFFERENTIAL/PLATELET
Basophils Absolute: 0 10*3/uL (ref 0.0–0.2)
Basos: 1 %
EOS (ABSOLUTE): 0.1 10*3/uL (ref 0.0–0.4)
Eos: 2 %
Hematocrit: 47.1 % — ABNORMAL HIGH (ref 34.0–46.6)
Hemoglobin: 15.2 g/dL (ref 11.1–15.9)
Immature Grans (Abs): 0 10*3/uL (ref 0.0–0.1)
Immature Granulocytes: 0 %
Lymphocytes Absolute: 1 10*3/uL (ref 0.7–3.1)
Lymphs: 18 %
MCH: 26.8 pg (ref 26.6–33.0)
MCHC: 32.3 g/dL (ref 31.5–35.7)
MCV: 83 fL (ref 79–97)
Monocytes Absolute: 0.4 10*3/uL (ref 0.1–0.9)
Monocytes: 7 %
Neutrophils Absolute: 4.2 10*3/uL (ref 1.4–7.0)
Neutrophils: 72 %
Platelets: 309 10*3/uL (ref 150–450)
RBC: 5.68 x10E6/uL — ABNORMAL HIGH (ref 3.77–5.28)
RDW: 13.7 % (ref 11.7–15.4)
WBC: 5.7 10*3/uL (ref 3.4–10.8)

## 2022-12-19 LAB — LIPID PANEL
Chol/HDL Ratio: 3.8 ratio (ref 0.0–4.4)
Cholesterol, Total: 245 mg/dL — ABNORMAL HIGH (ref 100–199)
HDL: 64 mg/dL (ref >39)
LDL Chol Calc (NIH): 161 mg/dL — ABNORMAL HIGH (ref 0–99)
Triglycerides: 111 mg/dL (ref 0–149)
VLDL Cholesterol Cal: 20 mg/dL (ref 5–40)

## 2022-12-19 LAB — HEMOGLOBIN A1C
Est. average glucose Bld gHb Est-mCnc: 117 mg/dL
Hgb A1c MFr Bld: 5.7 % — ABNORMAL HIGH (ref 4.8–5.6)

## 2022-12-19 LAB — VITAMIN D 25 HYDROXY (VIT D DEFICIENCY, FRACTURES): Vit D, 25-Hydroxy: 22.1 ng/mL — ABNORMAL LOW (ref 30.0–100.0)

## 2022-12-19 LAB — TSH: TSH: 1.65 u[IU]/mL (ref 0.450–4.500)

## 2022-12-21 ENCOUNTER — Other Ambulatory Visit: Payer: Self-pay | Admitting: Internal Medicine

## 2022-12-21 DIAGNOSIS — E782 Mixed hyperlipidemia: Secondary | ICD-10-CM

## 2022-12-21 MED ORDER — ROSUVASTATIN CALCIUM 5 MG PO TABS: 5.0000 mg | ORAL_TABLET | Freq: Every day | ORAL | 3 refills | Status: DC

## 2022-12-24 ENCOUNTER — Encounter: Payer: Self-pay | Admitting: Radiology

## 2023-02-14 IMAGING — DX DG KNEE 1-2V PORT*R*
1 series · 2 of 2 positions shown · non-contrast
Comparison: Radiograph July 25, 2021

CLINICAL DATA: Postoperative films for right knee replacement.

EXAM:
PORTABLE RIGHT KNEE - 1-2 VIEW

[Series 1: knee · 0.14mm/px · 2 of 2 slices shown]
[im 1/2]
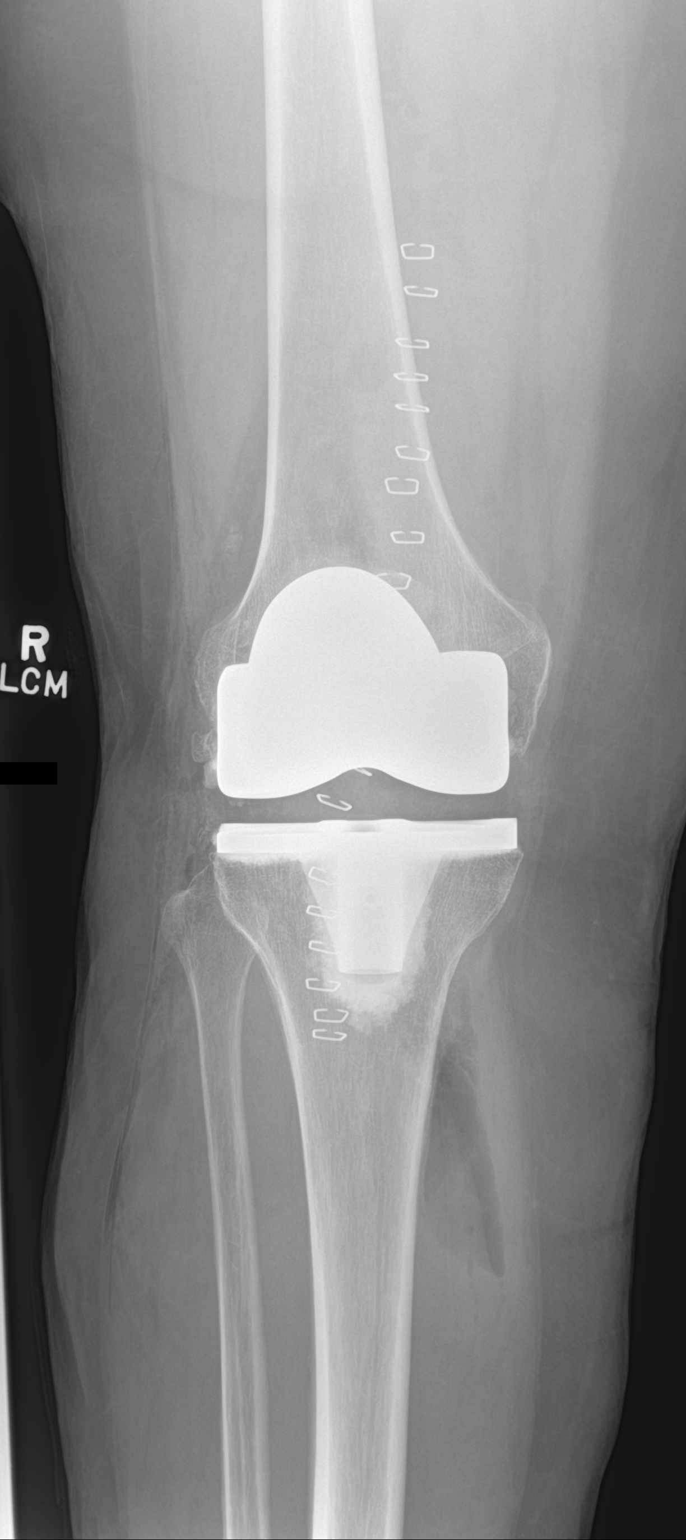
[im 2/2]
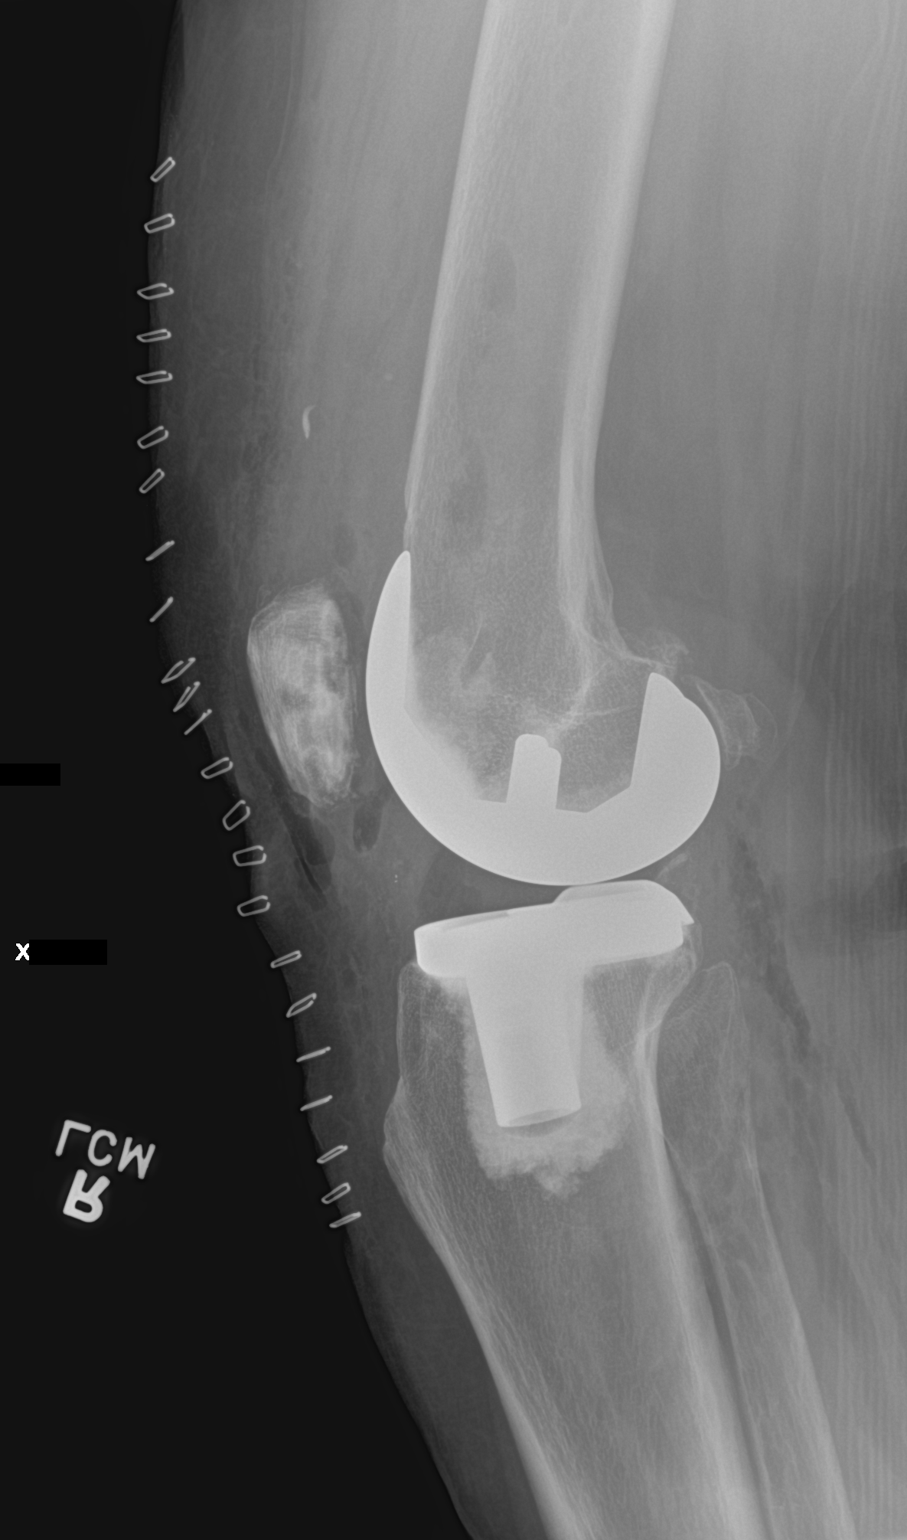

[2 of 2 positions shown; findings below may reference images not displayed]

FINDINGS: Postsurgical change of 3 component right total knee arthroplasty
without evidence of acute hardware complication.
IMPRESSION: Postsurgical change of right total knee arthroplasty without
evidence of acute hardware complication.

## 2023-04-23 ENCOUNTER — Ambulatory Visit (INDEPENDENT_AMBULATORY_CARE_PROVIDER_SITE_OTHER): Payer: Medicare HMO | Admitting: Internal Medicine

## 2023-04-23 ENCOUNTER — Encounter: Payer: Self-pay | Admitting: Internal Medicine

## 2023-04-23 VITALS — BP 101/68 | HR 69 | Ht 63.0 in | Wt 165.8 lb

## 2023-04-23 DIAGNOSIS — F419 Anxiety disorder, unspecified: Secondary | ICD-10-CM

## 2023-04-23 DIAGNOSIS — M810 Age-related osteoporosis without current pathological fracture: Secondary | ICD-10-CM | POA: Diagnosis not present

## 2023-04-23 DIAGNOSIS — E782 Mixed hyperlipidemia: Secondary | ICD-10-CM

## 2023-04-23 NOTE — Patient Instructions (Signed)
Please continue to take medications as prescribed. ? ?Please continue to follow low carb diet and perform moderate exercise/walking at least 150 mins/week. ?

## 2023-04-23 NOTE — Assessment & Plan Note (Addendum)
Overall well-controlled with Lexapro and Ativan Recently stressed due to ongoing divorce process Has been on Ativan for about 10 years, takes less than once per week

## 2023-04-23 NOTE — Progress Notes (Signed)
Established Patient Office Visit  Subjective:  Patient ID: Evelyn Le, female    DOB: [birth year]  Age: 67 y.o. MRN: [id number]  CC:  Chief Complaint  Patient presents with   Chronic Care Management    6 month f/u   Anxiety    Follow up for GAD, states she has been going through some things.     HPI Evelyn Le is a 67 y.o. female with past medical history of ovarian cancer s/p resection and chemotherapy, postoperative incisional hernia s/p repair and anxiety who presents for f/u of her chronic medical conditions.  GAD: She takes Lexapro 20 mg QD regularly. She takes Ativan PRN for anxiety, but takes it about 1 times in a week or even less.  She has has been stressed lately due to ongoing divorce.  She was going through court proceedings for her safety as well, but has not had any trouble from her neighbor since her husband has moved to [place].  HLD: She has been taking Crestor now.  Osteoporosis: She takes calcium and vitamin D supplements.  She had severe tremors and balance problem with Fosamax.  She had recurrent falls while taking it.  She has stopped taking it now.  She states that her tremors have resolved now.  She still denies Mammography and Colonoscopy as she had to go through treatment for ovarian cancer, which was traumatic for her.  Past Medical History:  Diagnosis Date   Anxiety    Cancer (HCC)    Depression    PONV (postoperative nausea and vomiting)     Past Surgical History:  Procedure Laterality Date   ABDOMINAL HYSTERECTOMY     APPENDECTOMY     HERNIA REPAIR     INCISIONAL HERNIA REPAIR  2010   REPLACEMENT TOTAL KNEE Left    TONSILLECTOMY     TOTAL KNEE ARTHROPLASTY Right 03/03/2022   Procedure: RIGHT TOTAL KNEE ARTHROPLASTY;  Surgeon: Kathryne Hitch, MD;  Location: [facility] OR;  Service: Orthopedics;  Laterality: Right;  RNFA    History reviewed. No pertinent family history.  Social History   Socioeconomic History   Marital  status: Legally Separated    Spouse name: Not on file   Number of children: Not on file   Years of education: Not on file   Highest education level: Not on file  Occupational History   Not on file  Tobacco Use   Smoking status: Never   Smokeless tobacco: Never  Vaping Use   Vaping Use: Never used  Substance and Sexual Activity   Alcohol use: Not Currently   Drug use: Never   Sexual activity: Not on file  Other Topics Concern   Not on file  Social History Narrative   Not on file   Social Determinants of Health   Financial Resource Strain: Low Risk  (11/17/2021)   Overall Financial Resource Strain (CARDIA)    Difficulty of Paying Living Expenses: Not hard at all  Food Insecurity: No Food Insecurity (11/17/2021)   Hunger Vital Sign    Worried About Running Out of Food in the Last Year: Never true    Ran Out of Food in the Last Year: Never true  Transportation Needs: No Transportation Needs (11/17/2021)   PRAPARE - Administrator, Civil Service (Medical): No    Lack of Transportation (Non-Medical): No  Physical Activity: Sufficiently Active (11/17/2021)   Exercise Vital Sign    Days of Exercise per Week: 5 days    Minutes of  Exercise per Session: 40 min  Stress: No Stress Concern Present (11/15/2020)   [employer] of Occupational Health - Occupational Stress Questionnaire    Feeling of Stress : Not at all  Social Connections: Moderately Integrated (11/17/2021)   Social Connection and Isolation Panel [NHANES]    Frequency of Communication with Friends and Family: More than three times a week    Frequency of Social Gatherings with Friends and Family: More than three times a week    Attends Religious Services: More than 4 times per year    Active Member of [employer] or Organizations: No    Attends Banker Meetings: Never    Marital Status: Married  Catering manager Violence: Not At Risk (11/15/2020)   Humiliation, Afraid, Rape, and Kick questionnaire     Fear of Current or Ex-Partner: No    Emotionally Abused: No    Physically Abused: No    Sexually Abused: No    Outpatient Medications Prior to Visit  Medication Sig Dispense Refill   acetaminophen (TYLENOL) 500 MG tablet Take 1,000 mg by mouth every 6 (six) hours as needed (pain.).     aspirin 81 MG chewable tablet Chew 1 tablet (81 mg total) by mouth 2 (two) times daily. 30 tablet 1   escitalopram (LEXAPRO) 20 MG tablet Take 1 tablet (20 mg total) by mouth daily. 90 tablet 3   gabapentin (NEURONTIN) 300 MG capsule Take 1 capsule (300 mg total) by mouth at bedtime. 30 capsule 1   LORazepam (ATIVAN) 0.5 MG tablet Take 1 tablet (0.5 mg total) by mouth at bedtime as needed for anxiety. 30 tablet 1   methocarbamol (ROBAXIN) 500 MG tablet Take 1 tablet (500 mg total) by mouth every 6 (six) hours as needed for muscle spasms. 30 tablet 1   ondansetron (ZOFRAN-ODT) 4 MG disintegrating tablet Take 1 tablet (4 mg total) by mouth every 8 (eight) hours as needed for nausea or vomiting. 20 tablet 0   rosuvastatin (CRESTOR) 5 MG tablet Take 1 tablet (5 mg total) by mouth daily. 90 tablet 3   hydrOXYzine (ATARAX) 25 MG tablet Take 1 tablet (25 mg total) by mouth every 8 (eight) hours as needed. 21 tablet 0   No facility-administered medications prior to visit.    Allergies  Allergen Reactions   Fosamax [Alendronate] Other (See Comments)    Tremors    Penicillins Other (See Comments)    Lose control of neck/mouth muscles    ROS Review of Systems  Constitutional:  Negative for chills and fever.  HENT:  Negative for congestion, sinus pressure, sinus pain and sore throat.   Eyes:  Negative for pain and discharge.  Respiratory:  Negative for cough and shortness of breath.   Cardiovascular:  Negative for chest pain and palpitations.  Gastrointestinal:  Negative for abdominal pain, constipation, diarrhea, nausea and vomiting.  Endocrine: Negative for polydipsia and polyuria.  Genitourinary:   Negative for dysuria and hematuria.  Musculoskeletal:  Negative for neck pain and neck stiffness.  Skin:  Negative for rash.  Neurological:  Negative for dizziness and weakness.  Psychiatric/Behavioral:  Negative for agitation, behavioral problems and suicidal ideas. The patient is nervous/anxious.       Objective:    Physical Exam Vitals reviewed.  Constitutional:      General: She is not in acute distress.    Appearance: She is not diaphoretic.  HENT:     Head: Normocephalic and atraumatic.     Nose: Nose normal.  Mouth/Throat:     Mouth: Mucous membranes are moist.  Eyes:     General: No scleral icterus.    Extraocular Movements: Extraocular movements intact.  Cardiovascular:     Rate and Rhythm: Normal rate and regular rhythm.     Pulses: Normal pulses.     Heart sounds: No murmur heard. Pulmonary:     Breath sounds: Normal breath sounds. No wheezing or rales.  Musculoskeletal:     Cervical back: Neck supple. No tenderness.     Right lower leg: No edema.     Left lower leg: No edema.  Skin:    General: Skin is warm.     Findings: No rash.  Neurological:     General: No focal deficit present.     Mental Status: She is alert and oriented to person, place, and time.  Psychiatric:        Mood and Affect: Mood normal.        Behavior: Behavior normal.     BP 101/68   Pulse 69   Ht 5\' 3"  (1.6 m)   Wt 165 lb 12.8 oz (75.2 kg)   SpO2 97%   BMI 29.37 kg/m  Wt Readings from Last 3 Encounters:  04/23/23 165 lb 12.8 oz (75.2 kg)  10/22/22 175 lb 9.6 oz (79.7 kg)  06/23/22 178 lb (80.7 kg)    Lab Results  Component Value Date   TSH 1.650 12/18/2022   Lab Results  Component Value Date   WBC 5.7 12/18/2022   HGB 15.2 12/18/2022   HCT 47.1 (H) 12/18/2022   MCV 83 12/18/2022   PLT 309 12/18/2022   Lab Results  Component Value Date   NA 141 12/18/2022   K 5.2 12/18/2022   CO2 20 12/18/2022   GLUCOSE 108 (H) 12/18/2022   BUN 15 12/18/2022   CREATININE  0.81 12/18/2022   BILITOT 0.5 12/18/2022   ALKPHOS 92 12/18/2022   AST 16 12/18/2022   ALT 8 12/18/2022   PROT 6.5 12/18/2022   ALBUMIN 4.5 12/18/2022   CALCIUM 9.5 12/18/2022   ANIONGAP 8 03/04/2022   EGFR 79 12/18/2022   Lab Results  Component Value Date   CHOL 245 (H) 12/18/2022   Lab Results  Component Value Date   HDL 64 12/18/2022   Lab Results  Component Value Date   LDLCALC 161 (H) 12/18/2022   Lab Results  Component Value Date   TRIG 111 12/18/2022   Lab Results  Component Value Date   CHOLHDL 3.8 12/18/2022   Lab Results  Component Value Date   HGBA1C 5.7 (H) 12/18/2022      Assessment & Plan:   Problem List Items Addressed This Visit       Musculoskeletal and Integument   Age-related osteoporosis without current pathological fracture    Did not tolerate Fosamax, had tremors, balance problems and recurrent falls while taking it Continue calcium and Vitamin D supplements Can try Boniva later        Other   Anxiety - Primary (Chronic)    Overall well-controlled with Lexapro and Ativan Recently stressed due to ongoing divorce process Has been on Ativan for about 10 years, takes less than once per week      Mixed hyperlipidemia (Chronic)    Check lipid profile On Crestor Advised to follow low cholesterol diet for now      Relevant Orders   CMP14+EGFR   Lipid Profile   No orders of the defined types were placed in  this encounter.   Follow-up: Return in about 6 months (around 10/24/2023) for Annual physical.    Anabel Halon, MD

## 2023-04-23 NOTE — Assessment & Plan Note (Addendum)
Check lipid profile On Crestor Advised to follow low cholesterol diet for now

## 2023-04-23 NOTE — Assessment & Plan Note (Addendum)
Did not tolerate Fosamax, had tremors, balance problems and recurrent falls while taking it Continue calcium and Vitamin D supplements Can try Boniva later

## 2023-04-24 LAB — CMP14+EGFR
ALT: 12 IU/L (ref 0–32)
AST: 18 IU/L (ref 0–40)
Albumin: 4.3 g/dL (ref 3.9–4.9)
Alkaline Phosphatase: 70 IU/L (ref 44–121)
BUN/Creatinine Ratio: 14 (ref 12–28)
BUN: 11 mg/dL (ref 8–27)
Bilirubin Total: 0.4 mg/dL (ref 0.0–1.2)
CO2: 19 mmol/L — ABNORMAL LOW (ref 20–29)
Calcium: 9.8 mg/dL (ref 8.7–10.3)
Chloride: 108 mmol/L — ABNORMAL HIGH (ref 96–106)
Creatinine, Ser: 0.81 mg/dL (ref 0.57–1.00)
Globulin, Total: 2 g/dL (ref 1.5–4.5)
Glucose: 98 mg/dL (ref 70–99)
Potassium: 4.4 mmol/L (ref 3.5–5.2)
Sodium: 141 mmol/L (ref 134–144)
Total Protein: 6.3 g/dL (ref 6.0–8.5)
eGFR: 79 mL/min/{1.73_m2} (ref >59)

## 2023-04-24 LAB — LIPID PANEL
Chol/HDL Ratio: 2.5 ratio (ref 0.0–4.4)
Cholesterol, Total: 169 mg/dL (ref 100–199)
HDL: 67 mg/dL (ref >39)
LDL Chol Calc (NIH): 87 mg/dL (ref 0–99)
Triglycerides: 83 mg/dL (ref 0–149)
VLDL Cholesterol Cal: 15 mg/dL (ref 5–40)

## 2023-05-27 DIAGNOSIS — E785 Hyperlipidemia, unspecified: Secondary | ICD-10-CM | POA: Diagnosis not present

## 2023-05-27 DIAGNOSIS — Z881 Allergy status to other antibiotic agents status: Secondary | ICD-10-CM | POA: Diagnosis not present

## 2023-05-27 DIAGNOSIS — F419 Anxiety disorder, unspecified: Secondary | ICD-10-CM | POA: Diagnosis not present

## 2023-05-27 DIAGNOSIS — Z8249 Family history of ischemic heart disease and other diseases of the circulatory system: Secondary | ICD-10-CM | POA: Diagnosis not present

## 2023-05-27 DIAGNOSIS — Z880 Allergy status to penicillin: Secondary | ICD-10-CM | POA: Diagnosis not present

## 2023-05-27 DIAGNOSIS — Z8543 Personal history of malignant neoplasm of ovary: Secondary | ICD-10-CM | POA: Diagnosis not present

## 2023-05-27 DIAGNOSIS — Z9181 History of falling: Secondary | ICD-10-CM | POA: Diagnosis not present

## 2023-05-27 DIAGNOSIS — Z008 Encounter for other general examination: Secondary | ICD-10-CM | POA: Diagnosis not present

## 2023-05-27 DIAGNOSIS — M1990 Unspecified osteoarthritis, unspecified site: Secondary | ICD-10-CM | POA: Diagnosis not present

## 2023-05-27 DIAGNOSIS — M810 Age-related osteoporosis without current pathological fracture: Secondary | ICD-10-CM | POA: Diagnosis not present

## 2023-05-27 DIAGNOSIS — F325 Major depressive disorder, single episode, in full remission: Secondary | ICD-10-CM | POA: Diagnosis not present

## 2023-10-26 ENCOUNTER — Encounter: Payer: Self-pay | Admitting: Internal Medicine

## 2023-10-26 ENCOUNTER — Ambulatory Visit (INDEPENDENT_AMBULATORY_CARE_PROVIDER_SITE_OTHER): Payer: Medicare HMO | Admitting: Internal Medicine

## 2023-10-26 VITALS — BP 97/63 | HR 79 | Ht 63.0 in | Wt 168.0 lb

## 2023-10-26 DIAGNOSIS — Z23 Encounter for immunization: Secondary | ICD-10-CM

## 2023-10-26 DIAGNOSIS — M65331 Trigger finger, right middle finger: Secondary | ICD-10-CM | POA: Insufficient documentation

## 2023-10-26 DIAGNOSIS — L729 Follicular cyst of the skin and subcutaneous tissue, unspecified: Secondary | ICD-10-CM | POA: Diagnosis not present

## 2023-10-26 DIAGNOSIS — Z0001 Encounter for general adult medical examination with abnormal findings: Secondary | ICD-10-CM | POA: Diagnosis not present

## 2023-10-26 DIAGNOSIS — M65341 Trigger finger, right ring finger: Secondary | ICD-10-CM | POA: Insufficient documentation

## 2023-10-26 DIAGNOSIS — E782 Mixed hyperlipidemia: Secondary | ICD-10-CM

## 2023-10-26 DIAGNOSIS — F419 Anxiety disorder, unspecified: Secondary | ICD-10-CM

## 2023-10-26 DIAGNOSIS — M810 Age-related osteoporosis without current pathological fracture: Secondary | ICD-10-CM | POA: Diagnosis not present

## 2023-10-26 DIAGNOSIS — R739 Hyperglycemia, unspecified: Secondary | ICD-10-CM | POA: Diagnosis not present

## 2023-10-26 DIAGNOSIS — E559 Vitamin D deficiency, unspecified: Secondary | ICD-10-CM | POA: Diagnosis not present

## 2023-10-26 NOTE — Assessment & Plan Note (Signed)
Physical exam as documented. Fasting blood tests ordered. Denies mammography and colon cancer screening.

## 2023-10-26 NOTE — Assessment & Plan Note (Signed)
 Simple exercises advised Offered Hand surgery referral, she prefers to see [facility], she plans to check if they can manage trigger finger

## 2023-10-26 NOTE — Assessment & Plan Note (Signed)
 Last vitamin D Lab Results  Component Value Date   VD25OH 22.1 (L) 12/18/2022   Advised to take Caltrate plus D3 twice daily

## 2023-10-26 NOTE — Assessment & Plan Note (Signed)
 Appears to be benign Advised to contact if increase in size or signs of inflammation

## 2023-10-26 NOTE — Progress Notes (Signed)
 Established Patient Office Visit  Subjective:  Patient ID: Evelyn Le, female    DOB: 10-08-1956  Age: 68 y.o. MRN: [id number]  CC:  Chief Complaint  Patient presents with   Annual Exam    CPE no concerns    HPI Evelyn Le is a 68 y.o. female with past medical history of ovarian cancer s/p resection and chemotherapy, postoperative incisional hernia s/p repair and anxiety who presents for annual physical.  GAD: She takes Lexapro  20 mg QD regularly. She takes Ativan  PRN for anxiety, but takes it about 1 times in a week or even less.  She has been less stressed now since divorce.  She was going through court proceedings for her safety as well, but has not had any trouble from her neighbor since her husband has moved to [place] .  HLD: She has been taking Crestor  now.  Osteoporosis: She takes calcium  and vitamin D  supplements.  She had severe tremors and balance problem with Fosamax .  She had recurrent falls while taking it.  She has stopped taking it now.  She states that her tremors have resolved now.  She still denies Mammography and Colonoscopy as she had to go through treatment for ovarian cancer, which was traumatic for her.  She reports her bump over right shoulder, which is chronic.  Denies any recent increase in size, local pain or swelling.  She has had drainage of a cyst over left shoulder area in the past.  She also reports locking of fingers of b/l hands - 3rd and 4th digits on right hand, 3rd digit on left hand.   Past Medical History:  Diagnosis Date   Anxiety    Cancer (HCC)    Depression    PONV (postoperative nausea and vomiting)     Past Surgical History:  Procedure Laterality Date   ABDOMINAL HYSTERECTOMY     APPENDECTOMY     HERNIA REPAIR     INCISIONAL HERNIA REPAIR  2010   REPLACEMENT TOTAL KNEE Left    TONSILLECTOMY     TOTAL KNEE ARTHROPLASTY Right 03/03/2022   Procedure: RIGHT TOTAL KNEE ARTHROPLASTY;  Surgeon: Vernetta Lonni GRADE, MD;  Location: [facility] OR;  Service: Orthopedics;  Laterality: Right;  RNFA    History reviewed. No pertinent family history.  Social History   Socioeconomic History   Marital status: Legally Separated    Spouse name: Not on file   Number of children: Not on file   Years of education: Not on file   Highest education level: Not on file  Occupational History   Not on file  Tobacco Use   Smoking status: Never   Smokeless tobacco: Never  Vaping Use   Vaping status: Never Used  Substance and Sexual Activity   Alcohol use: Not Currently   Drug use: Never   Sexual activity: Not on file  Other Topics Concern   Not on file  Social History Narrative   Not on file   Social Drivers of Health   Financial Resource Strain: Low Risk  (11/17/2021)   Overall Financial Resource Strain (CARDIA)    Difficulty of Paying Living Expenses: Not hard at all  Food Insecurity: No Food Insecurity (11/17/2021)   Hunger Vital Sign    Worried About Running Out of Food in the Last Year: Never true    Ran Out of Food in the Last Year: Never true  Transportation Needs: No Transportation Needs (11/17/2021)   PRAPARE - Administrator, Civil Service (Medical):  No    Lack of Transportation (Non-Medical): No  Physical Activity: Sufficiently Active (11/17/2021)   Exercise Vital Sign    Days of Exercise per Week: 5 days    Minutes of Exercise per Session: 40 min  Stress: No Stress Concern Present (11/15/2020)   [employer] of Occupational Health - Occupational Stress Questionnaire    Feeling of Stress : Not at all  Social Connections: Moderately Integrated (11/17/2021)   Social Connection and Isolation Panel [NHANES]    Frequency of Communication with Friends and Family: More than three times a week    Frequency of Social Gatherings with Friends and Family: More than three times a week    Attends Religious Services: More than 4 times per year    Active Member of [employer] or Organizations: No     Attends Banker Meetings: Never    Marital Status: Married  Catering Manager Violence: Not At Risk (11/15/2020)   Humiliation, Afraid, Rape, and Kick questionnaire    Fear of Current or Ex-Partner: No    Emotionally Abused: No    Physically Abused: No    Sexually Abused: No    Outpatient Medications Prior to Visit  Medication Sig Dispense Refill   acetaminophen  (TYLENOL ) 500 MG tablet Take 1,000 mg by mouth every 6 (six) hours as needed (pain.).     escitalopram  (LEXAPRO ) 20 MG tablet Take 1 tablet (20 mg total) by mouth daily. 90 tablet 3   LORazepam  (ATIVAN ) 0.5 MG tablet Take 1 tablet (0.5 mg total) by mouth at bedtime as needed for anxiety. 30 tablet 1   rosuvastatin  (CRESTOR ) 5 MG tablet Take 1 tablet (5 mg total) by mouth daily. 90 tablet 3   aspirin  81 MG chewable tablet Chew 1 tablet (81 mg total) by mouth 2 (two) times daily. 30 tablet 1   gabapentin  (NEURONTIN ) 300 MG capsule Take 1 capsule (300 mg total) by mouth at bedtime. 30 capsule 1   methocarbamol  (ROBAXIN ) 500 MG tablet Take 1 tablet (500 mg total) by mouth every 6 (six) hours as needed for muscle spasms. 30 tablet 1   ondansetron  (ZOFRAN -ODT) 4 MG disintegrating tablet Take 1 tablet (4 mg total) by mouth every 8 (eight) hours as needed for nausea or vomiting. 20 tablet 0   No facility-administered medications prior to visit.    Allergies  Allergen Reactions   Fosamax  [Alendronate ] Other (See Comments)    Tremors    Penicillins Other (See Comments)    Lose control of neck/mouth muscles    ROS Review of Systems  Constitutional:  Negative for chills and fever.  HENT:  Negative for congestion, sinus pressure, sinus pain and sore throat.   Eyes:  Negative for pain and discharge.  Respiratory:  Negative for cough and shortness of breath.   Cardiovascular:  Negative for chest pain and palpitations.  Gastrointestinal:  Negative for abdominal pain, constipation, diarrhea, nausea and vomiting.  Endocrine:  Negative for polydipsia and polyuria.  Genitourinary:  Negative for dysuria and hematuria.  Musculoskeletal:  Negative for neck pain and neck stiffness.  Skin:  Negative for rash.  Neurological:  Negative for dizziness and weakness.  Psychiatric/Behavioral:  Negative for agitation, behavioral problems and suicidal ideas. The patient is nervous/anxious.       Objective:    Physical Exam Vitals reviewed.  Constitutional:      General: She is not in acute distress.    Appearance: She is not diaphoretic.  HENT:     Head: Normocephalic and  atraumatic.     Nose: Nose normal.     Mouth/Throat:     Mouth: Mucous membranes are moist.  Eyes:     General: No scleral icterus.    Extraocular Movements: Extraocular movements intact.  Cardiovascular:     Rate and Rhythm: Normal rate and regular rhythm.     Pulses: Normal pulses.     Heart sounds: No murmur heard. Pulmonary:     Breath sounds: Normal breath sounds. No wheezing or rales.  Abdominal:     Palpations: Abdomen is soft.     Tenderness: There is no abdominal tenderness.  Musculoskeletal:     Cervical back: Neck supple. No tenderness.     Right lower leg: No edema.     Left lower leg: No edema.     Comments: Trigger finger - 3rd and 4th finger of right hand, 3rd finger of left hand  Skin:    General: Skin is warm.     Findings: No rash.     Comments: Cyst over right shoulder area - about 1 cm in diameter  Neurological:     General: No focal deficit present.     Mental Status: She is alert and oriented to person, place, and time.     Cranial Nerves: No cranial nerve deficit.     Sensory: No sensory deficit.     Motor: No weakness.  Psychiatric:        Mood and Affect: Mood normal.        Behavior: Behavior normal.     BP 97/63 (BP Location: Left Arm, Patient Position: Sitting, Cuff Size: Large)   Pulse 79   Ht 5' 3 (1.6 m)   Wt 168 lb (76.2 kg)   SpO2 97%   BMI 29.76 kg/m  Wt Readings from Last 3 Encounters:   10/26/23 168 lb (76.2 kg)  04/23/23 165 lb 12.8 oz (75.2 kg)  10/22/22 175 lb 9.6 oz (79.7 kg)    Lab Results  Component Value Date   TSH 1.650 12/18/2022   Lab Results  Component Value Date   WBC 5.7 12/18/2022   HGB 15.2 12/18/2022   HCT 47.1 (H) 12/18/2022   MCV 83 12/18/2022   PLT 309 12/18/2022   Lab Results  Component Value Date   NA 141 04/23/2023   K 4.4 04/23/2023   CO2 19 (L) 04/23/2023   GLUCOSE 98 04/23/2023   BUN 11 04/23/2023   CREATININE 0.81 04/23/2023   BILITOT 0.4 04/23/2023   ALKPHOS 70 04/23/2023   AST 18 04/23/2023   ALT 12 04/23/2023   PROT 6.3 04/23/2023   ALBUMIN 4.3 04/23/2023   CALCIUM  9.8 04/23/2023   ANIONGAP 8 03/04/2022   EGFR 79 04/23/2023   Lab Results  Component Value Date   CHOL 169 04/23/2023   Lab Results  Component Value Date   HDL 67 04/23/2023   Lab Results  Component Value Date   LDLCALC 87 04/23/2023   Lab Results  Component Value Date   TRIG 83 04/23/2023   Lab Results  Component Value Date   CHOLHDL 2.5 04/23/2023   Lab Results  Component Value Date   HGBA1C 5.7 (H) 12/18/2022      Assessment & Plan:   Problem List Items Addressed This Visit       Musculoskeletal and Integument   Age-related osteoporosis without current pathological fracture   Did not tolerate Fosamax , had tremors, balance problems and recurrent falls while taking it Continue calcium  and Vitamin D   supplements Can try Boniva later      Trigger middle finger of right hand   Simple exercises advised Offered Hand surgery referral, she prefers to see [facility], she plans to check if they can manage trigger finger        Other   Anxiety (Chronic)   Overall well-controlled with Lexapro  and Ativan  Has been on Ativan  for about 10 years, takes less than once per week      Relevant Orders   TSH   CMP14+EGFR   CBC with Differential/Platelet   Mixed hyperlipidemia (Chronic)   Check lipid profile On  Crestor  Advised to follow low cholesterol diet for now      Relevant Orders   Lipid panel   Vitamin D  deficiency   Last vitamin D  Lab Results  Component Value Date   VD25OH 22.1 (L) 12/18/2022   Advised to take Caltrate plus D3 twice daily      Relevant Orders   VITAMIN D  25 Hydroxy (Vit-D Deficiency, Fractures)   Encounter for general adult medical examination with abnormal findings - Primary   Physical exam as documented. Fasting blood tests ordered. Denies mammography and colon cancer screening.      Subcutaneous cyst over right shoulder   Appears to be benign Advised to contact if increase in size or signs of inflammation      Other Visit Diagnoses       Hyperglycemia       Relevant Orders   Hemoglobin A1c   CMP14+EGFR     Encounter for immunization       Relevant Orders   Flu Vaccine Trivalent High Dose (Fluad) (Completed)      No orders of the defined types were placed in this encounter.   Follow-up: Return in about 6 months (around 04/24/2024) for GAD and HLD.    Suzzane MARLA Blanch, MD

## 2023-10-26 NOTE — Patient Instructions (Signed)
 Please continue to take medications as prescribed.  Please continue to follow heart healthy diet and perform moderate exercise/walking at least 150 mins/week.

## 2023-10-26 NOTE — Assessment & Plan Note (Signed)
Check lipid profile On Crestor Advised to follow low cholesterol diet for now

## 2023-10-26 NOTE — Assessment & Plan Note (Signed)
 Overall well-controlled with Lexapro and Ativan Has been on Ativan for about 10 years, takes less than once per week

## 2023-10-26 NOTE — Assessment & Plan Note (Signed)
Did not tolerate Fosamax, had tremors, balance problems and recurrent falls while taking it Continue calcium and Vitamin D supplements Can try Boniva later

## 2023-10-27 LAB — CMP14+EGFR
ALT: 12 [IU]/L (ref 0–32)
AST: 17 [IU]/L (ref 0–40)
Albumin: 4.4 g/dL (ref 3.9–4.9)
Alkaline Phosphatase: 85 [IU]/L (ref 44–121)
BUN/Creatinine Ratio: 23 (ref 12–28)
BUN: 22 mg/dL (ref 8–27)
Bilirubin Total: 0.4 mg/dL (ref 0.0–1.2)
CO2: 21 mmol/L (ref 20–29)
Calcium: 9.7 mg/dL (ref 8.7–10.3)
Chloride: 104 mmol/L (ref 96–106)
Creatinine, Ser: 0.94 mg/dL (ref 0.57–1.00)
Globulin, Total: 1.9 g/dL (ref 1.5–4.5)
Glucose: 104 mg/dL — ABNORMAL HIGH (ref 70–99)
Potassium: 4.8 mmol/L (ref 3.5–5.2)
Sodium: 141 mmol/L (ref 134–144)
Total Protein: 6.3 g/dL (ref 6.0–8.5)
eGFR: 66 mL/min/{1.73_m2} (ref >59)

## 2023-10-27 LAB — CBC WITH DIFFERENTIAL/PLATELET
Basophils Absolute: 0 10*3/uL (ref 0.0–0.2)
Basos: 1 %
EOS (ABSOLUTE): 0.1 10*3/uL (ref 0.0–0.4)
Eos: 1 %
Hematocrit: 47.9 % — ABNORMAL HIGH (ref 34.0–46.6)
Hemoglobin: 15.5 g/dL (ref 11.1–15.9)
Immature Grans (Abs): 0 10*3/uL (ref 0.0–0.1)
Immature Granulocytes: 0 %
Lymphocytes Absolute: 1.1 10*3/uL (ref 0.7–3.1)
Lymphs: 19 %
MCH: 27.3 pg (ref 26.6–33.0)
MCHC: 32.4 g/dL (ref 31.5–35.7)
MCV: 84 fL (ref 79–97)
Monocytes Absolute: 0.4 10*3/uL (ref 0.1–0.9)
Monocytes: 7 %
Neutrophils Absolute: 4.2 10*3/uL (ref 1.4–7.0)
Neutrophils: 72 %
Platelets: 290 10*3/uL (ref 150–450)
RBC: 5.68 x10E6/uL — ABNORMAL HIGH (ref 3.77–5.28)
RDW: 12.1 % (ref 11.7–15.4)
WBC: 5.9 10*3/uL (ref 3.4–10.8)

## 2023-10-27 LAB — TSH: TSH: 1.2 u[IU]/mL (ref 0.450–4.500)

## 2023-10-27 LAB — LIPID PANEL
Chol/HDL Ratio: 2.5 {ratio} (ref 0.0–4.4)
Cholesterol, Total: 199 mg/dL (ref 100–199)
HDL: 80 mg/dL (ref >39)
LDL Chol Calc (NIH): 105 mg/dL — ABNORMAL HIGH (ref 0–99)
Triglycerides: 76 mg/dL (ref 0–149)
VLDL Cholesterol Cal: 14 mg/dL (ref 5–40)

## 2023-10-27 LAB — HEMOGLOBIN A1C
Est. average glucose Bld gHb Est-mCnc: 114 mg/dL
Hgb A1c MFr Bld: 5.6 % (ref 4.8–5.6)

## 2023-10-27 LAB — VITAMIN D 25 HYDROXY (VIT D DEFICIENCY, FRACTURES): Vit D, 25-Hydroxy: 28 ng/mL — ABNORMAL LOW (ref 30.0–100.0)

## 2024-01-09 ENCOUNTER — Other Ambulatory Visit: Payer: Self-pay | Admitting: Internal Medicine

## 2024-01-09 DIAGNOSIS — F419 Anxiety disorder, unspecified: Secondary | ICD-10-CM

## 2024-01-11 ENCOUNTER — Other Ambulatory Visit: Payer: Self-pay | Admitting: Internal Medicine

## 2024-01-11 DIAGNOSIS — F419 Anxiety disorder, unspecified: Secondary | ICD-10-CM

## 2024-01-18 ENCOUNTER — Ambulatory Visit (INDEPENDENT_AMBULATORY_CARE_PROVIDER_SITE_OTHER): Payer: Medicare HMO

## 2024-01-18 VITALS — BP 128/64 | Ht 63.0 in | Wt 162.0 lb

## 2024-01-18 DIAGNOSIS — Z0000 Encounter for general adult medical examination without abnormal findings: Secondary | ICD-10-CM

## 2024-01-18 NOTE — Progress Notes (Signed)
 Because this visit was a virtual/telehealth visit,  certain criteria was not obtained, such a blood pressure, CBG if applicable, and timed get up and go. Any medications not marked as "taking" were not mentioned during the medication reconciliation part of the visit. Any vitals not documented were not able to be obtained due to this being a telehealth visit or patient was unable to self-report a recent blood pressure reading due to a lack of equipment at home via telehealth. Vitals that have been documented are verbally provided by the patient.   Subjective:   Evelyn Le is a 68 y.o. who presents for a Medicare Wellness preventive visit.  Visit Complete: Virtual I connected with  Evelyn Le on 01/18/24 by a audio enabled telemedicine application and verified that I am speaking with the correct person using two identifiers.  Patient Location: Home  Provider Location: Home Office  I discussed the limitations of evaluation and management by telemedicine. The patient expressed understanding and agreed to proceed.  Vital Signs: Because this visit was a virtual/telehealth visit, some criteria may be missing or patient reported. Any vitals not documented were not able to be obtained and vitals that have been documented are patient reported.  VideoDeclined- This patient declined Librarian, academic. Therefore the visit was completed with audio only.  Persons Participating in Visit: Patient.  AWV Questionnaire: No: Patient Medicare AWV questionnaire was not completed prior to this visit.  Cardiac Risk Factors include: advanced age (>33men, >73 women)     Objective:    Today's Vitals   01/18/24 1422  BP: 128/64  Weight: 162 lb (73.5 kg)  Height: 5\' 3"  (1.6 m)   Body mass index is 28.7 kg/m.     01/18/2024    2:22 PM 11/18/2022    9:26 AM 03/24/2022   10:17 AM 03/03/2022   11:44 AM 02/20/2022   10:05 AM 11/17/2021   10:49 AM 11/15/2020    8:12 AM   Advanced Directives  Does Patient Have a Medical Advance Directive? No No No No No No No  Would patient like information on creating a medical advance directive? No - Patient declined No - Patient declined No - Patient declined No - Patient declined No - Patient declined Yes (ED - Information included in AVS) No - Patient declined    Current Medications (verified) Outpatient Encounter Medications as of 01/18/2024  Medication Sig   acetaminophen (TYLENOL) 500 MG tablet Take 1,000 mg by mouth every 6 (six) hours as needed (pain.).   escitalopram (LEXAPRO) 20 MG tablet Take 1 tablet by mouth once daily   LORazepam (ATIVAN) 0.5 MG tablet Take 1 tablet (0.5 mg total) by mouth at bedtime as needed for anxiety.   rosuvastatin (CRESTOR) 5 MG tablet Take 1 tablet (5 mg total) by mouth daily.   No facility-administered encounter medications on file as of 01/18/2024.    Allergies (verified) Fosamax [alendronate] and Penicillins   History: Past Medical History:  Diagnosis Date   Anxiety    Cancer (HCC)    Depression    PONV (postoperative nausea and vomiting)    Past Surgical History:  Procedure Laterality Date   ABDOMINAL HYSTERECTOMY     APPENDECTOMY     HERNIA REPAIR     INCISIONAL HERNIA REPAIR  2010   REPLACEMENT TOTAL KNEE Left    TONSILLECTOMY     TOTAL KNEE ARTHROPLASTY Right 03/03/2022   Procedure: RIGHT TOTAL KNEE ARTHROPLASTY;  Surgeon: Kathryne Hitch, MD;  Location: [facility] OR;  Service: Orthopedics;  Laterality: Right;  RNFA   History reviewed. No pertinent family history. Social History   Socioeconomic History   Marital status: Legally Separated    Spouse name: Not on file   Number of children: Not on file   Years of education: Not on file   Highest education level: Not on file  Occupational History   Not on file  Tobacco Use   Smoking status: Never   Smokeless tobacco: Never  Vaping Use   Vaping status: Never Used  Substance and Sexual Activity   Alcohol  use: Not Currently   Drug use: Never   Sexual activity: Not on file  Other Topics Concern   Not on file  Social History Narrative   Not on file   Social Drivers of Health   Financial Resource Strain: Low Risk  (01/18/2024)   Overall Financial Resource Strain (CARDIA)    Difficulty of Paying Living Expenses: Not hard at all  Food Insecurity: No Food Insecurity (01/18/2024)   Hunger Vital Sign    Worried About Running Out of Food in the Last Year: Never true    Ran Out of Food in the Last Year: Never true  Transportation Needs: No Transportation Needs (01/18/2024)   PRAPARE - Administrator, Civil Service (Medical): No    Lack of Transportation (Non-Medical): No  Physical Activity: Sufficiently Active (01/18/2024)   Exercise Vital Sign    Days of Exercise per Week: 5 days    Minutes of Exercise per Session: 40 min  Stress: No Stress Concern Present (01/18/2024)   [employer] of Occupational Health - Occupational Stress Questionnaire    Feeling of Stress : Not at all  Social Connections: Moderately Isolated (01/18/2024)   Social Connection and Isolation Panel [NHANES]    Frequency of Communication with Friends and Family: More than three times a week    Frequency of Social Gatherings with Friends and Family: More than three times a week    Attends Religious Services: Never    Database administrator or Organizations: No    Attends Engineer, structural: Never    Marital Status: Married    Tobacco Counseling Counseling given: Not Answered    Clinical Intake:  Pre-visit preparation completed: Yes  Pain : No/denies pain     BMI - recorded: 28.7 Nutritional Status: BMI 25 -29 Overweight Nutritional Risks: None Diabetes: No  Lab Results  Component Value Date   HGBA1C 5.6 10/26/2023   HGBA1C 5.7 (H) 12/18/2022     How often do you need to have someone help you when you read instructions, pamphlets, or other written materials from your doctor or  pharmacy?: 1 - Never What is the last grade level you completed in school?: some college  Interpreter Needed?: No  Information entered by :: Vesna Kable,CMA   Activities of Daily Living     01/18/2024    2:27 PM  In your present state of health, do you have any difficulty performing the following activities:  Hearing? 0  Vision? 0  Difficulty concentrating or making decisions? 0  Walking or climbing stairs? 0  Dressing or bathing? 0  Doing errands, shopping? 0  Preparing Food and eating ? N  Using the Toilet? N  In the past six months, have you accidently leaked urine? N  Do you have problems with loss of bowel control? N  Managing your Medications? N  Managing your Finances? N  Housekeeping or managing your Housekeeping? N  Patient Care Team: Anabel Halon, MD as PCP - General (Internal Medicine)  Indicate any recent Medical Services you may have received from other than [facility] providers in the past year (date may be approximate).     Assessment:   This is a routine wellness examination for Ahmia.  Hearing/Vision screen Hearing Screening - Comments:: Hearing aids  Vision Screening - Comments:: No vision issues    Goals Addressed               This Visit's Progress     Patient Stated (pt-stated)        Patient would like to stay active       Depression Screen     01/18/2024    2:28 PM 10/26/2023    8:22 AM 04/23/2023    8:42 AM 04/23/2023    8:36 AM 11/18/2022    9:28 AM 10/22/2022    8:34 AM 06/23/2022   10:23 AM  PHQ 2/9 Scores  PHQ - 2 Score 0 0 2 0 0 0 0  PHQ- 9 Score 0 2 4 0       Fall Risk     01/18/2024    2:26 PM 10/26/2023    8:22 AM 04/23/2023    8:36 AM 11/18/2022    9:29 AM 11/18/2022    9:28 AM  Fall Risk   Falls in the past year? 0 0 0 -- 1  Comment    Before having knee replacement   Number falls in past yr: 0 0 0  1  Injury with Fall? 0 0 0  0  Risk for fall due to : No Fall Risks No Fall Risks No Fall Risks    Follow up Falls  evaluation completed Falls evaluation completed Falls evaluation completed      MEDICARE RISK AT HOME:  Medicare Risk at Home Any stairs in or around the home?: No If so, are there any without handrails?: No Home free of loose throw rugs in walkways, pet beds, electrical cords, etc?: Yes Adequate lighting in your home to reduce risk of falls?: Yes Life alert?: No Use of a cane, walker or w/c?: No Grab bars in the bathroom?: Yes Shower chair or bench in shower?: No Elevated toilet seat or a handicapped toilet?: No  TIMED UP AND GO:  Was the test performed?  No  Cognitive Function: 6CIT completed        01/18/2024    2:24 PM 11/18/2022    9:29 AM 11/17/2021   11:00 AM 11/15/2020    8:13 AM  6CIT Screen  What Year? 0 points 0 points 0 points 0 points  What month? 0 points 0 points 0 points 0 points  What time? 0 points 0 points 0 points 0 points  Count back from 20 0 points 0 points 0 points 0 points  Months in reverse 0 points 0 points 0 points 0 points  Repeat phrase 0 points 0 points 0 points   Total Score 0 points 0 points 0 points     Immunizations Immunization History  Administered Date(s) Administered   Fluad Quad(high Dose 65+) 07/29/2022   Fluad Trivalent(High Dose 65+) 10/26/2023   Moderna Sars-Covid-2 Vaccination 01/17/2020, 02/14/2020, 09/18/2020   PNEUMOCOCCAL CONJUGATE-20 10/09/2021   Zoster Recombinant(Shingrix) 05/19/2022, 07/19/2022    Screening Tests Health Maintenance  Topic Date Due   DTaP/Tdap/Td (1 - Tdap) Never done   Colonoscopy  Never done   MAMMOGRAM  Never done   INFLUENZA VACCINE  05/19/2024  Medicare Annual Wellness (AWV)  01/17/2025   Pneumonia Vaccine 44+ Years old  Completed   DEXA SCAN  Completed   Hepatitis C Screening  Completed   Zoster Vaccines- Shingrix  Completed   HPV VACCINES  Aged Out   COVID-19 Vaccine  Discontinued    Health Maintenance  Health Maintenance Due  Topic Date Due   DTaP/Tdap/Td (1 - Tdap) Never done    Colonoscopy  Never done   MAMMOGRAM  Never done   Health Maintenance Items Addressed:  Additional Screening:  Vision Screening: Recommended annual ophthalmology exams for early detection of glaucoma and other disorders of the eye.  Dental Screening: Recommended annual dental exams for proper oral hygiene  Community Resource Referral / Chronic Care Management: CRR required this visit?  No   CCM required this visit?  No     Plan:     I have personally reviewed and noted the following in the patient's chart:   Medical and social history Use of alcohol, tobacco or illicit drugs  Current medications and supplements including opioid prescriptions. Patient is not currently taking opioid prescriptions. Functional ability and status Nutritional status Physical activity Advanced directives List of other physicians Hospitalizations, surgeries, and ER visits in previous 12 months Vitals Screenings to include cognitive, depression, and falls Referrals and appointments  In addition, I have reviewed and discussed with patient certain preventive protocols, quality metrics, and best practice recommendations. A written personalized care plan for preventive services as well as general preventive health recommendations were provided to patient.     Rudi Heap, [place]   01/18/2024   After Visit Summary: (Mail) Due to this being a telephonic visit, the after visit summary with patients personalized plan was offered to patient via mail   Notes: Nothing significant to report at this time.

## 2024-01-18 NOTE — Patient Instructions (Signed)
 [name] , Thank you for taking time to come for your Medicare Wellness Visit. I appreciate your ongoing commitment to your health goals. Please review the following plan we discussed and let me know if I can assist you in the future.   Referrals/Orders/Follow-Ups/Clinician Recommendations: follow up in one year   This is a list of the screening recommended for you and due dates:  Health Maintenance  Topic Date Due   DTaP/Tdap/Td vaccine (1 - Tdap) Never done   Colon Cancer Screening  Never done   Mammogram  Never done   Medicare Annual Wellness Visit  11/19/2023   Flu Shot  05/19/2024   Pneumonia Vaccine  Completed   DEXA scan (bone density measurement)  Completed   Hepatitis C Screening  Completed   Zoster (Shingles) Vaccine  Completed   HPV Vaccine  Aged Out   COVID-19 Vaccine  Discontinued    Advanced directives: (Declined) Advance directive discussed with you today. Even though you declined this today, please call our office should you change your mind, and we can give you the proper paperwork for you to fill out.  Next Medicare Annual Wellness Visit scheduled for next year: Yes

## 2024-03-11 ENCOUNTER — Other Ambulatory Visit: Payer: Self-pay | Admitting: Internal Medicine

## 2024-03-11 DIAGNOSIS — E782 Mixed hyperlipidemia: Secondary | ICD-10-CM

## 2024-04-10 ENCOUNTER — Other Ambulatory Visit: Payer: Self-pay | Admitting: Internal Medicine

## 2024-04-10 DIAGNOSIS — F419 Anxiety disorder, unspecified: Secondary | ICD-10-CM

## 2024-04-24 ENCOUNTER — Encounter: Payer: Self-pay | Admitting: Internal Medicine

## 2024-04-24 ENCOUNTER — Ambulatory Visit (INDEPENDENT_AMBULATORY_CARE_PROVIDER_SITE_OTHER): Payer: Medicare HMO | Admitting: Internal Medicine

## 2024-04-24 VITALS — BP 98/61 | HR 68 | Ht 63.0 in | Wt 167.0 lb

## 2024-04-24 DIAGNOSIS — R7303 Prediabetes: Secondary | ICD-10-CM

## 2024-04-24 DIAGNOSIS — M19042 Primary osteoarthritis, left hand: Secondary | ICD-10-CM | POA: Diagnosis not present

## 2024-04-24 DIAGNOSIS — M65331 Trigger finger, right middle finger: Secondary | ICD-10-CM | POA: Diagnosis not present

## 2024-04-24 DIAGNOSIS — F419 Anxiety disorder, unspecified: Secondary | ICD-10-CM | POA: Diagnosis not present

## 2024-04-24 DIAGNOSIS — M19041 Primary osteoarthritis, right hand: Secondary | ICD-10-CM | POA: Diagnosis not present

## 2024-04-24 DIAGNOSIS — E782 Mixed hyperlipidemia: Secondary | ICD-10-CM

## 2024-04-24 DIAGNOSIS — M810 Age-related osteoporosis without current pathological fracture: Secondary | ICD-10-CM | POA: Diagnosis not present

## 2024-04-24 MED ORDER — LORAZEPAM 0.5 MG PO TABS: 0.5000 mg | ORAL_TABLET | Freq: Every evening | ORAL | 1 refills | Status: DC | PRN

## 2024-04-24 NOTE — Assessment & Plan Note (Addendum)
 Overall well-controlled with Lexapro  and Ativan  Has been on Ativan  for about 10 years, takes less than once per week - PDMP reviewed, refilled

## 2024-04-24 NOTE — Assessment & Plan Note (Signed)
 Has mild arthritic changes on both hands Voltaren gel as needed Tylenol  arthritis as needed for pain

## 2024-04-24 NOTE — Progress Notes (Signed)
 Established Patient Office Visit  Subjective:  Patient ID: Evelyn Le, female    DOB: 12-01-1955  Age: 68 y.o. MRN: [id number]  CC:  Chief Complaint  Patient presents with   Medical Management of Chronic Issues    6 month f/u     HPI Evelyn Le is a 68 y.o. female with past medical history of ovarian cancer s/p resection and chemotherapy, postoperative incisional hernia s/p repair and anxiety who presents for f/u of her chronic medical conditions.  GAD: She takes Lexapro  20 mg QD regularly. She takes Ativan  PRN for anxiety, but takes it about 1 times in a week or even less.  She has been less stressed now since divorce.  She was going through court proceedings for her safety as well, but has not had any trouble from her neighbor since her husband has moved to [place] .  HLD: She has been taking Crestor  now.  Osteoporosis: She takes calcium  and vitamin D  supplements.  She had severe tremors and balance problem with Fosamax .  She had recurrent falls while taking it. She states that her tremors have resolved now.  She still denies Mammography and Colonoscopy as she had to go through treatment for ovarian cancer, which was traumatic for her.  She also reports locking of fingers of b/l hands - 3rd and 4th digits on right hand, 3rd digit on left hand.   Past Medical History:  Diagnosis Date   Anxiety    Cancer (HCC)    Depression    PONV (postoperative nausea and vomiting)     Past Surgical History:  Procedure Laterality Date   ABDOMINAL HYSTERECTOMY     APPENDECTOMY     HERNIA REPAIR     INCISIONAL HERNIA REPAIR  2010   REPLACEMENT TOTAL KNEE Left    TONSILLECTOMY     TOTAL KNEE ARTHROPLASTY Right 03/03/2022   Procedure: RIGHT TOTAL KNEE ARTHROPLASTY;  Surgeon: Vernetta Lonni GRADE, MD;  Location: [facility] OR;  Service: Orthopedics;  Laterality: Right;  RNFA    History reviewed. No pertinent family history.  Social History   Socioeconomic History   Marital status: Legally  Separated    Spouse name: Not on file   Number of children: Not on file   Years of education: Not on file   Highest education level: Bachelor's degree (e.g., BA, AB, BS)  Occupational History   Not on file  Tobacco Use   Smoking status: Never   Smokeless tobacco: Never  Vaping Use   Vaping status: Never Used  Substance and Sexual Activity   Alcohol use: Not Currently   Drug use: Never   Sexual activity: Not on file  Other Topics Concern   Not on file  Social History Narrative   Not on file   Social Drivers of Health   Financial Resource Strain: Low Risk  (04/23/2024)   Overall Financial Resource Strain (CARDIA)    Difficulty of Paying Living Expenses: Not very hard  Food Insecurity: No Food Insecurity (04/23/2024)   Hunger Vital Sign    Worried About Running Out of Food in the Last Year: Never true    Ran Out of Food in the Last Year: Never true  Transportation Needs: No Transportation Needs (04/23/2024)   PRAPARE - Administrator, Civil Service (Medical): No    Lack of Transportation (Non-Medical): No  Physical Activity: Unknown (04/23/2024)   Exercise Vital Sign    Days of Exercise per Week: 5 days    Minutes of Exercise  per Session: Patient declined  Stress: Stress Concern Present (04/23/2024)   [employer] of Occupational Health - Occupational Stress Questionnaire    Feeling of Stress: To some extent  Social Connections: Socially Isolated (04/23/2024)   Social Connection and Isolation Panel    Frequency of Communication with Friends and Family: More than three times a week    Frequency of Social Gatherings with Friends and Family: More than three times a week    Attends Religious Services: Patient declined    Database administrator or Organizations: No    Attends Engineer, structural: Not on file    Marital Status: Divorced  Intimate Partner Violence: Not At Risk (01/18/2024)   Humiliation, Afraid, Rape, and Kick questionnaire    Fear of Current or  Ex-Partner: No    Emotionally Abused: No    Physically Abused: No    Sexually Abused: No    Outpatient Medications Prior to Visit  Medication Sig Dispense Refill   acetaminophen  (TYLENOL ) 500 MG tablet Take 1,000 mg by mouth every 6 (six) hours as needed (pain.).     escitalopram  (LEXAPRO ) 20 MG tablet Take 1 tablet by mouth once daily 90 tablet 0   rosuvastatin  (CRESTOR ) 5 MG tablet Take 1 tablet by mouth once daily 90 tablet 0   LORazepam  (ATIVAN ) 0.5 MG tablet Take 1 tablet (0.5 mg total) by mouth at bedtime as needed for anxiety. 30 tablet 1   No facility-administered medications prior to visit.    Allergies  Allergen Reactions   Fosamax  [Alendronate ] Other (See Comments)    Tremors    Penicillins Other (See Comments)    Lose control of neck/mouth muscles    ROS Review of Systems  Constitutional:  Negative for chills and fever.  HENT:  Negative for congestion, sinus pressure, sinus pain and sore throat.   Eyes:  Negative for pain and discharge.  Respiratory:  Negative for cough and shortness of breath.   Cardiovascular:  Negative for chest pain and palpitations.  Gastrointestinal:  Negative for abdominal pain, diarrhea, nausea and vomiting.  Endocrine: Negative for polydipsia and polyuria.  Genitourinary:  Negative for dysuria and hematuria.  Musculoskeletal:  Negative for neck pain and neck stiffness.  Skin:  Negative for rash.  Neurological:  Negative for dizziness and weakness.  Psychiatric/Behavioral:  Negative for agitation, behavioral problems and suicidal ideas. The patient is nervous/anxious.       Objective:    Physical Exam Vitals reviewed.  Constitutional:      General: She is not in acute distress.    Appearance: She is not diaphoretic.  HENT:     Head: Normocephalic and atraumatic.     Nose: Nose normal.     Mouth/Throat:     Mouth: Mucous membranes are moist.  Eyes:     General: No scleral icterus.    Extraocular Movements: Extraocular  movements intact.  Cardiovascular:     Rate and Rhythm: Normal rate and regular rhythm.     Pulses: Normal pulses.     Heart sounds: No murmur heard. Pulmonary:     Breath sounds: Normal breath sounds. No wheezing or rales.  Musculoskeletal:     Cervical back: Neck supple. No tenderness.     Right lower leg: No edema.     Left lower leg: No edema.     Comments: Trigger finger - 3rd and 4th finger of right hand, 3rd finger of left hand Heberden and Bouchard nodes bilaterally  Skin:  General: Skin is warm.     Findings: No rash.     Comments: Cyst over right shoulder area - about 1 cm in diameter  Neurological:     General: No focal deficit present.     Mental Status: She is alert and oriented to person, place, and time.     Sensory: No sensory deficit.     Motor: No weakness.  Psychiatric:        Mood and Affect: Mood normal.        Behavior: Behavior normal.     BP 98/61   Pulse 68   Ht 5' 3 (1.6 m)   Wt 167 lb (75.8 kg)   SpO2 97%   BMI 29.58 kg/m  Wt Readings from Last 3 Encounters:  04/24/24 167 lb (75.8 kg)  01/18/24 162 lb (73.5 kg)  10/26/23 168 lb (76.2 kg)    Lab Results  Component Value Date   TSH 1.200 10/26/2023   Lab Results  Component Value Date   WBC 5.9 10/26/2023   HGB 15.5 10/26/2023   HCT 47.9 (H) 10/26/2023   MCV 84 10/26/2023   PLT 290 10/26/2023   Lab Results  Component Value Date   NA 141 10/26/2023   K 4.8 10/26/2023   CO2 21 10/26/2023   GLUCOSE 104 (H) 10/26/2023   BUN 22 10/26/2023   CREATININE 0.94 10/26/2023   BILITOT 0.4 10/26/2023   ALKPHOS 85 10/26/2023   AST 17 10/26/2023   ALT 12 10/26/2023   PROT 6.3 10/26/2023   ALBUMIN 4.4 10/26/2023   CALCIUM  9.7 10/26/2023   ANIONGAP 8 03/04/2022   EGFR 66 10/26/2023   Lab Results  Component Value Date   CHOL 199 10/26/2023   Lab Results  Component Value Date   HDL 80 10/26/2023   Lab Results  Component Value Date   LDLCALC 105 (H) 10/26/2023   Lab Results   Component Value Date   TRIG 76 10/26/2023   Lab Results  Component Value Date   CHOLHDL 2.5 10/26/2023   Lab Results  Component Value Date   HGBA1C 5.6 10/26/2023      Assessment & Plan:   Problem List Items Addressed This Visit       Musculoskeletal and Integument   Age-related osteoporosis without current pathological fracture   Did not tolerate Fosamax , had tremors, balance problems and recurrent falls while taking it Continue calcium  and Vitamin D  supplements Recheck DEXA scan - can try Boniva later      Relevant Orders   DG Bone Density   Trigger middle finger of right hand   Simple exercises advised She prefers to see [place] [facility], but prefers to wait for now      Primary osteoarthritis of both hands   Has mild arthritic changes on both hands Voltaren gel as needed Tylenol  arthritis as needed for pain        Other   Anxiety (Chronic)   Overall well-controlled with Lexapro  and Ativan  Has been on Ativan  for about 10 years, takes less than once per week - PDMP reviewed, refilled      Relevant Medications   LORazepam  (ATIVAN ) 0.5 MG tablet   Mixed hyperlipidemia - Primary (Chronic)   Check lipid profile On Crestor  Advised to follow low cholesterol diet for now      Relevant Orders   CMP14+EGFR   Lipid Profile   Prediabetes   Lab Results  Component Value Date   HGBA1C 5.6 10/26/2023  Advised to follow low carb diet for now       Meds ordered this encounter  Medications   LORazepam  (ATIVAN ) 0.5 MG tablet    Sig: Take 1 tablet (0.5 mg total) by mouth at bedtime as needed for anxiety.    Dispense:  30 tablet    Refill:  1    Follow-up: Return in about 6 months (around 10/25/2024) for Annual physical (after 10/25/24).    Suzzane MARLA Blanch, MD

## 2024-04-24 NOTE — Patient Instructions (Signed)
 Please continue to take medications as prescribed.  Please continue to follow low cholesterol diet and perform moderate exercise/walking at least 150 mins/week.  Please maintain at least 64 ounces of fluid intake.

## 2024-04-24 NOTE — Assessment & Plan Note (Addendum)
 Did not tolerate Fosamax , had tremors, balance problems and recurrent falls while taking it Continue calcium  and Vitamin D  supplements Recheck DEXA scan - can try Boniva later

## 2024-04-24 NOTE — Assessment & Plan Note (Signed)
 Lab Results  Component Value Date   HGBA1C 5.6 10/26/2023   Advised to follow low carb diet for now

## 2024-04-24 NOTE — Assessment & Plan Note (Signed)
Check lipid profile On Crestor Advised to follow low cholesterol diet for now

## 2024-04-24 NOTE — Assessment & Plan Note (Signed)
 Simple exercises advised She prefers to see [facility], but prefers to wait for now

## 2024-04-25 ENCOUNTER — Ambulatory Visit: Payer: Self-pay | Admitting: Internal Medicine

## 2024-04-25 LAB — CMP14+EGFR
ALT: 10 IU/L (ref 0–32)
AST: 19 IU/L (ref 0–40)
Albumin: 4.3 g/dL (ref 3.9–4.9)
Alkaline Phosphatase: 72 IU/L (ref 44–121)
BUN/Creatinine Ratio: 18 (ref 12–28)
BUN: 14 mg/dL (ref 8–27)
Bilirubin Total: 0.5 mg/dL (ref 0.0–1.2)
CO2: 20 mmol/L (ref 20–29)
Calcium: 9.3 mg/dL (ref 8.7–10.3)
Chloride: 103 mmol/L (ref 96–106)
Creatinine, Ser: 0.8 mg/dL (ref 0.57–1.00)
Globulin, Total: 1.8 g/dL (ref 1.5–4.5)
Glucose: 92 mg/dL (ref 70–99)
Potassium: 4.8 mmol/L (ref 3.5–5.2)
Sodium: 137 mmol/L (ref 134–144)
Total Protein: 6.1 g/dL (ref 6.0–8.5)
eGFR: 80 mL/min/1.73 (ref >59)

## 2024-04-25 LAB — LIPID PANEL
Chol/HDL Ratio: 2.6 ratio (ref 0.0–4.4)
Cholesterol, Total: 179 mg/dL (ref 100–199)
HDL: 68 mg/dL (ref >39)
LDL Chol Calc (NIH): 95 mg/dL (ref 0–99)
Triglycerides: 86 mg/dL (ref 0–149)
VLDL Cholesterol Cal: 16 mg/dL (ref 5–40)

## 2024-04-26 ENCOUNTER — Telehealth: Payer: Self-pay | Admitting: Family Medicine

## 2024-04-26 NOTE — Telephone Encounter (Signed)
 Patient called with questions regarding if insurance would cover the injections for her appointment on Monday (trigger finger).  Would you be able to provide me with codes that may be used during the visit so the patient can check coverage?

## 2024-04-26 NOTE — Telephone Encounter (Signed)
 Ultrasound guidance: [id number] [place] -[phone]: [id number]

## 2024-04-26 NOTE — Telephone Encounter (Signed)
 Patient informed.

## 2024-04-28 ENCOUNTER — Ambulatory Visit (HOSPITAL_COMMUNITY)
Admission: RE | Admit: 2024-04-28 | Discharge: 2024-04-28 | Disposition: A | Discharge status: Home / Self Care | Source: Ambulatory Visit | Attending: Internal Medicine | Admitting: Internal Medicine

## 2024-04-28 DIAGNOSIS — M810 Age-related osteoporosis without current pathological fracture: Secondary | ICD-10-CM | POA: Insufficient documentation

## 2024-05-01 ENCOUNTER — Encounter: Payer: Self-pay | Admitting: Family Medicine

## 2024-05-01 ENCOUNTER — Ambulatory Visit: Admitting: Family Medicine

## 2024-05-01 ENCOUNTER — Other Ambulatory Visit: Payer: Self-pay

## 2024-05-01 ENCOUNTER — Ambulatory Visit (INDEPENDENT_AMBULATORY_CARE_PROVIDER_SITE_OTHER)

## 2024-05-01 VITALS — BP 104/76 | HR 67 | Ht 63.0 in | Wt 163.0 lb

## 2024-05-01 DIAGNOSIS — M79644 Pain in right finger(s): Secondary | ICD-10-CM

## 2024-05-01 DIAGNOSIS — M65341 Trigger finger, right ring finger: Secondary | ICD-10-CM

## 2024-05-01 DIAGNOSIS — M810 Age-related osteoporosis without current pathological fracture: Secondary | ICD-10-CM | POA: Diagnosis not present

## 2024-05-01 DIAGNOSIS — M79641 Pain in right hand: Secondary | ICD-10-CM | POA: Diagnosis not present

## 2024-05-01 DIAGNOSIS — M19041 Primary osteoarthritis, right hand: Secondary | ICD-10-CM | POA: Diagnosis not present

## 2024-05-01 NOTE — Progress Notes (Signed)
 I, Leotis Batter, CMA acting as a scribe for Artist Lloyd, MD.  Evelyn Le is a 68 y.o. female who presents to [facility] at [facility] today for right 3rd and 4th trigger finger. Sx present over the past several years, worsening over the past 6 months. Also c/o arthritis in the hand. Small nodule at the 4th digit. Has tried Tylenol  Arthritis with no significant relief. Sx worse with gripping/grasping.   She does have osteoporosis she recently had a bone density test that showed worsening osteoporosis with a T-score of -2.9. She has tried Fosamax  in the past but had allergic reaction to it causing jitteriness and tremor.  She has not been on other medications for osteoporosis.  She is not taking vitamin D  currently.  Vitamin D  was checked in January and found to be low less than 30.  Pertinent review of systems: No fevers or chills  Relevant historical information: Trigger finger.  Osteoporosis.  History of large ovarian cyst.   Exam:  BP 104/76   Pulse 67   Ht 5' 3 (1.6 m)   Wt 163 lb (73.9 kg)   SpO2 97%   BMI 28.87 kg/m  General: Well Developed, well nourished, and in no acute distress.   MSK: Right hand degenerative appearing changes multiple locations including worse at third PIP.  Triggering present with flexion of fourth PIP with pain felt at the palmar MCP.    Lab and Radiology Results  Procedure: Real-time Ultrasound Guided Injection of right fourth MCP tendon sheath at A1 pulley.  Trigger finger injection Device: Philips Affiniti 50G/GE Logiq Images permanently stored and available for review in PACS Verbal informed consent obtained.  Discussed risks and benefits of procedure. Warned about infection, bleeding, hyperglycemia damage to structures among others. Patient expresses understanding and agreement Time-out conducted.   Noted no overlying erythema, induration, or other signs of local infection.   Skin prepped in a sterile fashion.   Local  anesthesia: Topical Ethyl chloride.   With sterile technique and under real time ultrasound guidance: 20 mg of Kenalog  and 0.5 mL of lidocaine injected into A1 pulley tendon sheath. Fluid seen entering the tendon sheath.   Completed without difficulty   Pain immediately resolved suggesting accurate placement of the medication.   Advised to call if fevers/chills, erythema, induration, drainage, or persistent bleeding.   Images permanently stored and available for review in the ultrasound unit.  Impression: Technically successful ultrasound guided injection.  Procedure: Real-time Ultrasound Guided Injection of right third PIP Device: Philips Affiniti 50G/GE Logiq Images permanently stored and available for review in PACS Verbal informed consent obtained.  Discussed risks and benefits of procedure. Warned about infection, bleeding, hyperglycemia damage to structures among others. Patient expresses understanding and agreement Time-out conducted.   Noted no overlying erythema, induration, or other signs of local infection.   Skin prepped in a sterile fashion.   Local anesthesia: Topical Ethyl chloride.   With sterile technique and under real time ultrasound guidance: 20 mg of Kenalog  and 0.5 mL of lidocaine injected into third PIP. Fluid seen entering the joint capsule.   Completed without difficulty   Pain immediately resolved suggesting accurate placement of the medication.   Advised to call if fevers/chills, erythema, induration, drainage, or persistent bleeding.   Images permanently stored and available for review in the ultrasound unit.  Impression: Technically successful ultrasound guided injection.    X-ray images right hand obtained today personally and independently interpreted. First CMC DJD.  Patient does  have degenerative changes across PIPs and DIPs.  No acute fractures are visible. Await formal radiology review  No results found for this or any previous visit (from the past 72  hours). DG Bone Density Result Date: 04/28/2024 EXAM: DUAL X-RAY ABSORPTIOMETRY (DXA) FOR BONE MINERAL DENSITY 04/28/2024 8:43 am CLINICAL DATA:  68 year old Female Postmenopausal. Screening for osteoporosis TECHNIQUE: An axial (e.g., hips, spine) and/or appendicular (e.g., radius) exam was performed, as appropriate, using [employer] Psychologist, sport and exercise at [facility]. Images are obtained for bone mineral density measurement and are not obtained for diagnostic purposes. [id number] Exclusions: Lumbar spine due to advanced degenerative changes COMPARISON:  None. New baseline. FINDINGS: Scan quality: Good. LEFT FEMORAL NECK: BMD (in g/cm2): 0.700 T-score: -2.4 Z-score: -0.8 LEFT TOTAL HIP: BMD (in g/cm2): 0.741 T-score: -2.1 Z-score: -0.7 RIGHT FEMORAL NECK: BMD (in g/cm2): 0.691 T-score: -2.5 Z-score: -0.8 RIGHT TOTAL HIP: BMD (in g/cm2): 0.716 T-score: -2.3 Z-score: -0.9 LEFT FOREARM (RADIUS 33%): BMD (in g/cm2): 0.501 T-score: -2.9 Z-score: -1.1 FRAX 10-YEAR PROBABILITY OF FRACTURE: FRAX not reported as the lowest BMD is not in the osteopenia range. IMPRESSION: Osteoporosis based on BMD. Fracture risk is increased. Increased risk is based on low BMD. RECOMMENDATIONS: 1. All patients should optimize calcium  and vitamin D  intake. 2. Consider FDA-approved medical therapies in postmenopausal women and men aged 15 years and older, based on the following: - A hip or vertebral (clinical or morphometric) fracture - T-score less than or equal to -2.5 and secondary causes have been excluded. - Low bone mass (T-score between -1.0 and -2.5) and a 10-year probability of a hip fracture greater than or equal to 3% or a 10-year probability of a major osteoporosis-related fracture greater than or equal to 20% based on the [place] -adapted WHO algorithm. - Clinician judgment and/or patient preferences may indicate treatment for people with 10-year fracture probabilities above or below these levels 3. Patients with diagnosis of  osteoporosis or at high risk for fracture should have regular bone mineral density tests. For patients eligible for Medicare, routine testing is allowed once every 2 years. The testing frequency can be increased to one year for patients who have rapidly progressing disease, those who are receiving or discontinuing medical therapy to restore bone mass, or have additional risk factors. Electronically Signed   By: Reyes Phi M.D.   On: 04/28/2024 10:13       Assessment and Plan: 68 y.o. female with  Right hand pain multifactorial.  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  Plan for trigger finger injection and right third PIP injection.  Recommend considering hand therapy.  Happy to place referral to occupational therapy at [facility] if needed.  Additionally she has osteoporosis with a T-score of -2.9.  Recommend vitamin D  2000 to 5000 units daily over-the-counter.  Additionally talked about medication options.  She is allergic to Fosamax  therefore in my opinion any bisphosphonate should not be used including infusion Reclast.  Her best option is Prolia.  Will work on authorization for Prolia either in my office or in infusion center.  Anticipate rechecking vitamin D  at the 49-month second injection for Prolia.   PDMP not reviewed this encounter. Orders Placed This Encounter  Procedures   US  LIMITED JOINT SPACE STRUCTURES UP RIGHT(NO LINKED CHARGES)    Reason for Exam (SYMPTOM  OR DIAGNOSIS REQUIRED):   right trigger fingers    Preferred imaging location?:   [place]  [facility] Hand Complete Right    Standing Status:   Future    Number of Occurrences:   1    Expiration Date:   06/01/2024    Reason for Exam (SYMPTOM  OR DIAGNOSIS REQUIRED):   right hand pain    Preferred imaging location?:   [facility]   No orders of the defined types were placed in this encounter.    Discussed warning signs or symptoms. Please  see discharge instructions. Patient expresses understanding.   The above documentation has been reviewed and is accurate and complete Artist Lloyd, M.D.

## 2024-05-01 NOTE — Patient Instructions (Addendum)
 Thank you for coming in today.   You received an injection today. Seek immediate medical attention if the joint becomes red, extremely painful, or is oozing fluid.   Please get an Xray today before you leave.  Order placed for Prolia with [facility].   Let us  know if you would like to start hand therapy.   Recommend taking Vitamin D  2,000-5,000 international units daily.     See you back as needed.

## 2024-05-02 ENCOUNTER — Ambulatory Visit: Payer: Self-pay | Admitting: Family Medicine

## 2024-05-02 NOTE — Progress Notes (Signed)
 Right hand x-ray shows mild arthritis mostly in the ends of the fingers.

## 2024-05-03 ENCOUNTER — Telehealth: Payer: Self-pay

## 2024-05-03 NOTE — Addendum Note (Signed)
 Addended by: MARDY LEOTIS RAMAN on: 05/03/2024 01:33 PM   Modules accepted: Orders

## 2024-05-03 NOTE — Telephone Encounter (Signed)
 Called and spoke with patient to review timing and cost of Prolia injections.   Prolia is q6m with a 20% co-insurance for each visit.   Pt states that Dr. Joane advised that Prolia could be done in office here. Will double check and get back with pt.

## 2024-05-03 NOTE — Telephone Encounter (Signed)
 Reach out to pt to review Prolia cost.

## 2024-05-03 NOTE — Telephone Encounter (Signed)
 Order for Prolia sent to [facility].

## 2024-05-03 NOTE — Telephone Encounter (Signed)
 [employer] - Prior Authorization REQUIRED for [employer]

## 2024-05-08 NOTE — Telephone Encounter (Signed)
 Hey Keyana,  Pt reached out for update on prior auth, received a call from insurance about approval but was unsure if she was supposed to pick up from the pharmacy or if the medication would be provided in office.  Also inquiring about scheduling.   Thanks in advance!

## 2024-05-09 ENCOUNTER — Encounter: Payer: Self-pay | Admitting: Family Medicine

## 2024-05-09 ENCOUNTER — Telehealth: Payer: Self-pay

## 2024-05-09 NOTE — Telephone Encounter (Signed)
 Hello,  Patient will be scheduled as soon as possible.  Auth Submission: APPROVED Site of care: Site of care: [facility] Payer: aetna medicare Medication & CPT/J Code(s) submitted: Prolia (Denosumab) [id number] Diagnosis Code:  Route of submission (phone, fax, portal): portal Phone # Fax # Auth type: Buy/Bill PB Units/visits requested: 60mg  q12months, x 2 doses Reference number: [id number] Approval from: 05/09/24 to 05/09/25

## 2024-05-09 NOTE — Telephone Encounter (Signed)
 Hi, We will provide the medication in office. Nurse will be schedule patient tomorrow. Thanks!

## 2024-05-10 NOTE — Telephone Encounter (Signed)
Noted, my chart message sent to pt.

## 2024-05-10 NOTE — Telephone Encounter (Signed)
 Ezra Lamp, CPhT    05/09/24  2:45 PM Note Hello,   Patient will be scheduled as soon as possible.   Auth Submission: APPROVED Site of care: Site of care: [facility] Payer: aetna medicare Medication & CPT/J Code(s) submitted: Prolia (Denosumab) [id number] Diagnosis Code:  Route of submission (phone, fax, portal): portal Phone # Fax # Auth type: Buy/Bill PB Units/visits requested: 60mg  q80months, x 2 doses Reference number: [id number] Approval from: 05/09/24 to 05/09/25

## 2024-05-10 NOTE — Telephone Encounter (Signed)
 Scheduled 05/12/24

## 2024-05-12 ENCOUNTER — Encounter: Attending: Family Medicine | Admitting: Emergency Medicine

## 2024-05-12 VITALS — BP 124/74 | HR 76 | Temp 98.2°F | Resp 16

## 2024-05-12 DIAGNOSIS — M810 Age-related osteoporosis without current pathological fracture: Secondary | ICD-10-CM | POA: Diagnosis not present

## 2024-05-12 MED ORDER — DENOSUMAB 60 MG/ML ~~LOC~~ SOSY
60.0000 mg | PREFILLED_SYRINGE | Freq: Once | SUBCUTANEOUS | Status: AC
  Administered 2024-05-12: 60 mg via SUBCUTANEOUS

## 2024-05-12 NOTE — Progress Notes (Signed)
 Diagnosis: Osteoporosis  Provider:  Joane Artist RAMAN  Procedure: Injection  Prolia (Denosumab), Dose: 60 mg, Site: subcutaneous, Number of injections: 1  Injection Site(s): Left arm  Post Care: Observation period completed  Discharge: Condition: Good, Destination: Home . AVS Declined  Performed by:  Delon ONEIDA Officer, RN

## 2024-05-12 NOTE — Telephone Encounter (Signed)
 Last Prolia inj 05/12/24  Next Prolia inj due 11/13/24

## 2024-06-06 ENCOUNTER — Other Ambulatory Visit: Payer: Self-pay | Admitting: Internal Medicine

## 2024-06-06 DIAGNOSIS — E782 Mixed hyperlipidemia: Secondary | ICD-10-CM

## 2024-06-08 ENCOUNTER — Other Ambulatory Visit: Payer: Self-pay | Admitting: Internal Medicine

## 2024-06-08 DIAGNOSIS — E782 Mixed hyperlipidemia: Secondary | ICD-10-CM

## 2024-06-22 ENCOUNTER — Other Ambulatory Visit: Payer: Self-pay | Admitting: Internal Medicine

## 2024-06-22 DIAGNOSIS — E782 Mixed hyperlipidemia: Secondary | ICD-10-CM

## 2024-07-04 ENCOUNTER — Other Ambulatory Visit: Payer: Self-pay | Admitting: Internal Medicine

## 2024-07-04 DIAGNOSIS — F419 Anxiety disorder, unspecified: Secondary | ICD-10-CM

## 2024-07-25 DIAGNOSIS — Z1212 Encounter for screening for malignant neoplasm of rectum: Secondary | ICD-10-CM | POA: Diagnosis not present

## 2024-07-25 DIAGNOSIS — Z1211 Encounter for screening for malignant neoplasm of colon: Secondary | ICD-10-CM | POA: Diagnosis not present

## 2024-07-26 LAB — COLOGUARD: Cologuard: NEGATIVE

## 2024-07-28 NOTE — Progress Notes (Signed)
 Evelyn Le                                          MRN: [id number]   07/28/2024   The [facility] Quality Team Specialist reviewed this patient medical record for the purposes of chart review for care gap closure. The following were reviewed: chart review for care gap closure-colorectal cancer screening.    VBCI Quality Team

## 2024-07-30 LAB — COLOGUARD: COLOGUARD: NEGATIVE

## 2024-07-31 ENCOUNTER — Ambulatory Visit: Payer: Self-pay | Admitting: Internal Medicine

## 2024-07-31 ENCOUNTER — Encounter: Payer: Self-pay | Admitting: Family Medicine

## 2024-08-11 ENCOUNTER — Encounter: Payer: Self-pay | Admitting: Internal Medicine

## 2024-08-31 ENCOUNTER — Other Ambulatory Visit: Payer: Self-pay | Admitting: Internal Medicine

## 2024-08-31 DIAGNOSIS — E782 Mixed hyperlipidemia: Secondary | ICD-10-CM

## 2024-10-02 NOTE — Telephone Encounter (Signed)
 [facility] - will need to place new order.   Prolia  VOB initiated via [email or web address]  Next Prolia  inj DUE: 11/13/24

## 2024-10-03 ENCOUNTER — Other Ambulatory Visit: Payer: Self-pay | Admitting: Internal Medicine

## 2024-10-03 DIAGNOSIS — F419 Anxiety disorder, unspecified: Secondary | ICD-10-CM

## 2024-10-27 NOTE — Telephone Encounter (Signed)
 [employer] - Prior Authorization REQUIRED for [employer]

## 2024-10-27 NOTE — Telephone Encounter (Signed)
 Evelyn Le, Patient is scheduled for 11/17/24 @ [facility]. Everything is good to go.  Thanks Luke

## 2024-10-27 NOTE — Telephone Encounter (Addendum)
 Hi Team, is the order that's in for Prolia  (therapy II) still good or do we need to place another order?  Auth Submission: APPROVED Site of care: Site of care: [facility] Payer: aetna medicare Medication & CPT/J Code(s) submitted: Prolia  (Denosumab ) [id number] Diagnosis Code:  Route of submission (phone, fax, portal): portal Phone # Fax # Auth type: Buy/Bill PB Units/visits requested: 60mg  q14months, x 2 doses Reference number: [id number] Approval from: 05/09/24 to 05/09/25

## 2024-10-30 ENCOUNTER — Ambulatory Visit (INDEPENDENT_AMBULATORY_CARE_PROVIDER_SITE_OTHER): Admitting: Internal Medicine

## 2024-10-30 ENCOUNTER — Encounter: Payer: Self-pay | Admitting: Internal Medicine

## 2024-10-30 VITALS — BP 108/74 | HR 76 | Ht 63.0 in | Wt 168.8 lb

## 2024-10-30 DIAGNOSIS — Z5320 Procedure and treatment not carried out because of patient's decision for unspecified reasons: Secondary | ICD-10-CM

## 2024-10-30 DIAGNOSIS — R7303 Prediabetes: Secondary | ICD-10-CM

## 2024-10-30 DIAGNOSIS — Z0001 Encounter for general adult medical examination with abnormal findings: Secondary | ICD-10-CM | POA: Diagnosis not present

## 2024-10-30 DIAGNOSIS — M810 Age-related osteoporosis without current pathological fracture: Secondary | ICD-10-CM

## 2024-10-30 DIAGNOSIS — E559 Vitamin D deficiency, unspecified: Secondary | ICD-10-CM | POA: Diagnosis not present

## 2024-10-30 DIAGNOSIS — E782 Mixed hyperlipidemia: Secondary | ICD-10-CM | POA: Diagnosis not present

## 2024-10-30 DIAGNOSIS — M65341 Trigger finger, right ring finger: Secondary | ICD-10-CM

## 2024-10-30 DIAGNOSIS — F419 Anxiety disorder, unspecified: Secondary | ICD-10-CM | POA: Diagnosis not present

## 2024-10-30 MED ORDER — ESCITALOPRAM OXALATE 20 MG PO TABS: 20.0000 mg | ORAL_TABLET | Freq: Every day | ORAL | 3 refills | Status: AC

## 2024-10-30 NOTE — Assessment & Plan Note (Signed)
 Last vitamin D  Lab Results  Component Value Date   VD25OH 28.0 (L) 10/26/2023   Advised to take Caltrate plus D3 twice daily

## 2024-10-30 NOTE — Assessment & Plan Note (Addendum)
 Did not tolerate Fosamax , had tremors, balance problems and recurrent falls while taking it Continue calcium  and Vitamin D  supplements Rechecked DEXA scan - has started Prolia  now

## 2024-10-30 NOTE — Assessment & Plan Note (Addendum)
 Overall well-controlled with Lexapro  and Ativan  Has been on Ativan  for about 10 years, takes less than once per week - PDMP reviewed

## 2024-10-30 NOTE — Assessment & Plan Note (Signed)
 Lab Results  Component Value Date   HGBA1C 5.6 10/26/2023   Advised to follow low carb diet for now

## 2024-10-30 NOTE — Assessment & Plan Note (Signed)
Physical exam as documented. Fasting blood tests ordered. Denies mammography and colon cancer screening.

## 2024-10-30 NOTE — Assessment & Plan Note (Signed)
Check lipid profile On Crestor Advised to follow low cholesterol diet for now

## 2024-10-30 NOTE — Telephone Encounter (Signed)
 [employer]  Patient is ready for scheduling on or after 11/13/24  Out-of-pocket cost due at time of visit: $392.56  Primary: A[employer]na Medicare Advantage  Prolia  co-insurance: 20% (approximately $352.56) Admin fee co-insurance: $40  Deductible: does not apply  Prior Auth: APPROVED Reference number: [id number] Approval from: 05/09/24 to 05/09/25  Secondary: N/A Prolia  co-insurance:  Admin fee co-insurance:  Deductible:  Prior Auth:  PA# Valid:   ** This summary of benefits is an estimation of the patient's out-of-pocket cost. Exact cost may vary based on individual plan coverage.

## 2024-10-30 NOTE — Progress Notes (Addendum)
 "  Established Patient Office Visit  Subjective:  Patient ID: Evelyn Le, female    DOB: [birth year]  Age: 69 y.o. MRN: [id number]  CC:  Chief Complaint  Patient presents with   Annual Exam    Cpe/ 6 month f/u     HPI Evelyn Le is a 69 y.o. female with past medical history of ovarian cancer s/p resection and chemotherapy, postoperative incisional hernia s/p repair and anxiety who presents for f/u of her chronic medical conditions.  GAD: She takes Lexapro  20 mg QD regularly. She takes Ativan  PRN for anxiety, but takes it about 1 times in a week or even less.  She has been less stressed now since divorce.  She was going through court proceedings for her safety as well, but has not had any trouble from her neighbor since her husband has moved to [place] .  HLD: She has been taking Crestor  now.  Osteoporosis: She has started Prolia  now, gets it at infusion clinic.  She takes calcium  and vitamin D  supplements.  She had severe tremors and balance problem with Fosamax .  She had recurrent falls while taking it. She states that her tremors have resolved now.  She still denies Mammography as she had to go through treatment for ovarian cancer, which was traumatic for her.  She had locking of fingers of b/l hands - 3rd and 4th digits on right hand, 3rd digit on left hand. She had steroid injection by Dr. Joane.   Past Medical History:  Diagnosis Date   Anxiety    Cancer (HCC)    Depression    PONV (postoperative nausea and vomiting)     Past Surgical History:  Procedure Laterality Date   ABDOMINAL HYSTERECTOMY     APPENDECTOMY     HERNIA REPAIR     INCISIONAL HERNIA REPAIR  2010   REPLACEMENT TOTAL KNEE Left    TONSILLECTOMY     TOTAL KNEE ARTHROPLASTY Right 03/03/2022   Procedure: RIGHT TOTAL KNEE ARTHROPLASTY;  Surgeon: Vernetta Lonni GRADE, MD;  Location: [facility] OR;  Service: Orthopedics;  Laterality: Right;  RNFA    History reviewed. No pertinent family history.  Social History    Socioeconomic History   Marital status: Legally Separated    Spouse name: Not on file   Number of children: Not on file   Years of education: Not on file   Highest education level: Bachelor's degree (e.g., BA, AB, BS)  Occupational History   Not on file  Tobacco Use   Smoking status: Never   Smokeless tobacco: Never  Vaping Use   Vaping status: Never Used  Substance and Sexual Activity   Alcohol use: Not Currently   Drug use: Never   Sexual activity: Not on file  Other Topics Concern   Not on file  Social History Narrative   Not on file   Social Drivers of Health   Tobacco Use: Low Risk (10/30/2024)   Patient History    Smoking Tobacco Use: Never    Smokeless Tobacco Use: Never    Passive Exposure: Not on file  Financial Resource Strain: Low Risk (04/23/2024)   Overall Financial Resource Strain (CARDIA)    Difficulty of Paying Living Expenses: Not very hard  Food Insecurity: No Food Insecurity (04/23/2024)   Epic    Worried About Radiation Protection Practitioner of Food in the Last Year: Never true    Ran Out of Food in the Last Year: Never true  Transportation Needs: No Transportation Needs (04/23/2024)   Epic  Lack of Transportation (Medical): No    Lack of Transportation (Non-Medical): No  Physical Activity: Unknown (04/23/2024)   Exercise Vital Sign    Days of Exercise per Week: 5 days    Minutes of Exercise per Session: Patient declined  Stress: Stress Concern Present (04/23/2024)   [employer] of Occupational Health - Occupational Stress Questionnaire    Feeling of Stress: To some extent  Social Connections: Socially Isolated (04/23/2024)   Social Connection and Isolation Panel    Frequency of Communication with Friends and Family: More than three times a week    Frequency of Social Gatherings with Friends and Family: More than three times a week    Attends Religious Services: Patient declined    Database Administrator or Organizations: No    Attends Hospital Doctor: Not on file    Marital Status: Divorced  Intimate Partner Violence: Not At Risk (01/18/2024)   Humiliation, Afraid, Rape, and Kick questionnaire    Fear of Current or Ex-Partner: No    Emotionally Abused: No    Physically Abused: No    Sexually Abused: No  Depression (PHQ2-9): Low Risk (10/30/2024)   Depression (PHQ2-9)    PHQ-2 Score: 2  Alcohol Screen: Low Risk (04/23/2024)   Alcohol Screen    Last Alcohol Screening Score (AUDIT): 1  Housing: Low Risk (04/23/2024)   Epic    Unable to Pay for Housing in the Last Year: No    Number of Times Moved in the Last Year: 0    Homeless in the Last Year: No  Utilities: Not At Risk (01/18/2024)   [facility] Utilities    Threatened with loss of utilities: No  Health Literacy: Not on file    Outpatient Medications Prior to Visit  Medication Sig Dispense Refill   acetaminophen  (TYLENOL ) 500 MG tablet Take 1,000 mg by mouth every 6 (six) hours as needed (pain.).     denosumab  (PROLIA ) 60 MG/ML SOSY injection Inject 60 mg into the skin every 6 (six) months.     LORazepam  (ATIVAN ) 0.5 MG tablet Take 1 tablet (0.5 mg total) by mouth at bedtime as needed for anxiety. 30 tablet 1   rosuvastatin  (CRESTOR ) 5 MG tablet Take 1 tablet by mouth once daily 90 tablet 0   Vit B6-Vit B12-Omega 3 Acids (VITAMIN B PLUS+ PO) Take by mouth.     escitalopram  (LEXAPRO ) 20 MG tablet Take 1 tablet by mouth once daily 90 tablet 0   No facility-administered medications prior to visit.    Allergies  Allergen Reactions   Fosamax  [Alendronate ] Other (See Comments)    Tremors    Penicillins Other (See Comments)    Lose control of neck/mouth muscles    ROS Review of Systems  Constitutional:  Negative for chills and fever.  HENT:  Negative for congestion, sinus pressure, sinus pain and sore throat.   Eyes:  Negative for pain and discharge.  Respiratory:  Negative for cough and shortness of breath.   Cardiovascular:  Negative for chest pain and palpitations.   Gastrointestinal:  Negative for abdominal pain, diarrhea, nausea and vomiting.  Endocrine: Negative for polydipsia and polyuria.  Genitourinary:  Negative for dysuria and hematuria.  Musculoskeletal:  Negative for neck pain and neck stiffness.  Skin:  Negative for rash.  Neurological:  Negative for dizziness and weakness.  Psychiatric/Behavioral:  Negative for agitation, behavioral problems and suicidal ideas. The patient is nervous/anxious.       Objective:    Physical Exam  Vitals reviewed.  Constitutional:      General: She is not in acute distress.    Appearance: She is not diaphoretic.  HENT:     Head: Normocephalic and atraumatic.     Nose: Nose normal.     Mouth/Throat:     Mouth: Mucous membranes are moist.  Eyes:     General: No scleral icterus.    Extraocular Movements: Extraocular movements intact.  Cardiovascular:     Rate and Rhythm: Normal rate and regular rhythm.     Pulses: Normal pulses.     Heart sounds: No murmur heard. Pulmonary:     Breath sounds: Normal breath sounds. No wheezing or rales.  Musculoskeletal:     Cervical back: Neck supple. No tenderness.     Right lower leg: No edema.     Left lower leg: No edema.     Comments: Trigger finger - 3rd and 4th finger of right hand, 3rd finger of left hand Heberden and Bouchard nodes bilaterally  Skin:    General: Skin is warm.     Findings: No rash.     Comments: Cyst over right shoulder area - about 1 cm in diameter  Neurological:     General: No focal deficit present.     Mental Status: She is alert and oriented to person, place, and time.     Sensory: No sensory deficit.     Motor: No weakness.  Psychiatric:        Mood and Affect: Mood normal.        Behavior: Behavior normal.     BP 108/74   Pulse 76   Ht 5' 3 (1.6 m)   Wt 168 lb 12.8 oz (76.6 kg)   SpO2 99%   BMI 29.90 kg/m  Wt Readings from Last 3 Encounters:  10/30/24 168 lb 12.8 oz (76.6 kg)  05/01/24 163 lb (73.9 kg)  04/24/24  167 lb (75.8 kg)    Lab Results  Component Value Date   TSH 1.200 10/26/2023   Lab Results  Component Value Date   WBC 5.9 10/26/2023   HGB 15.5 10/26/2023   HCT 47.9 (H) 10/26/2023   MCV 84 10/26/2023   PLT 290 10/26/2023   Lab Results  Component Value Date   NA 137 04/24/2024   K 4.8 04/24/2024   CO2 20 04/24/2024   GLUCOSE 92 04/24/2024   BUN 14 04/24/2024   CREATININE 0.80 04/24/2024   BILITOT 0.5 04/24/2024   ALKPHOS 72 04/24/2024   AST 19 04/24/2024   ALT 10 04/24/2024   PROT 6.1 04/24/2024   ALBUMIN 4.3 04/24/2024   CALCIUM  9.3 04/24/2024   ANIONGAP 8 03/04/2022   EGFR 80 04/24/2024   Lab Results  Component Value Date   CHOL 179 04/24/2024   Lab Results  Component Value Date   HDL 68 04/24/2024   Lab Results  Component Value Date   LDLCALC 95 04/24/2024   Lab Results  Component Value Date   TRIG 86 04/24/2024   Lab Results  Component Value Date   CHOLHDL 2.6 04/24/2024   Lab Results  Component Value Date   HGBA1C 5.6 10/26/2023      Assessment & Plan:   Problem List Items Addressed This Visit       Musculoskeletal and Integument   Age-related osteoporosis without current pathological fracture   Did not tolerate Fosamax , had tremors, balance problems and recurrent falls while taking it Continue calcium  and Vitamin D  supplements Rechecked DEXA scan -  has started Prolia  now      Relevant Medications   denosumab  (PROLIA ) 60 MG/ML SOSY injection   Other Relevant Orders   CMP14+EGFR   CBC with Differential/Platelet   Trigger ring finger of right hand   Simple exercises advised Has seen [facility] - s/p steroid injection        Other   Anxiety (Chronic)   Overall well-controlled with Lexapro  and Ativan  Has been on Ativan  for about 10 years, takes less than once per week - PDMP reviewed      Relevant Medications   escitalopram  (LEXAPRO ) 20 MG tablet   Other Relevant Orders   TSH   CMP14+EGFR   CBC with  Differential/Platelet   Mixed hyperlipidemia (Chronic)   Check lipid profile On Crestor  Advised to follow low cholesterol diet for now      Relevant Orders   Lipid panel   Vitamin D  deficiency   Last vitamin D  Lab Results  Component Value Date   VD25OH 28.0 (L) 10/26/2023   Advised to take Caltrate plus D3 twice daily      Relevant Orders   VITAMIN D  25 Hydroxy (Vit-D Deficiency, Fractures)   Prediabetes   Lab Results  Component Value Date   HGBA1C 5.6 10/26/2023   Advised to follow low carb diet for now      Relevant Orders   Hemoglobin A1c   CMP14+EGFR   Encounter for general adult medical examination with abnormal findings - Primary   Physical exam as documented. Fasting blood tests ordered. Denies mammography and colon cancer screening.      Other Visit Diagnoses       Mammogram declined           Meds ordered this encounter  Medications   escitalopram  (LEXAPRO ) 20 MG tablet    Sig: Take 1 tablet (20 mg total) by mouth daily.    Dispense:  90 tablet    Refill:  3    Follow-up: Return in about 6 months (around 04/29/2025) for GAD and HLD.    Suzzane MARLA Blanch, MD "

## 2024-10-30 NOTE — Assessment & Plan Note (Signed)
 Simple exercises advised Has seen [facility] - s/p steroid injection

## 2024-10-30 NOTE — Telephone Encounter (Signed)
 VOB initiated for Jubbonti via [facility] portal.

## 2024-10-30 NOTE — Patient Instructions (Signed)
 Please continue to take medications as prescribed.  Please continue to follow low carb diet and perform moderate exercise/walking at least 150 mins/week.  Please consider getting Tdap vaccine at local pharmacy.

## 2024-10-31 ENCOUNTER — Ambulatory Visit: Payer: Self-pay | Admitting: Internal Medicine

## 2024-10-31 ENCOUNTER — Other Ambulatory Visit: Payer: Self-pay | Admitting: Internal Medicine

## 2024-10-31 DIAGNOSIS — R7989 Other specified abnormal findings of blood chemistry: Secondary | ICD-10-CM

## 2024-10-31 LAB — HEMOGLOBIN A1C
Est. average glucose Bld gHb Est-mCnc: 114 mg/dL
Hgb A1c MFr Bld: 5.6 % (ref 4.8–5.6)

## 2024-10-31 LAB — CBC WITH DIFFERENTIAL/PLATELET
Basophils Absolute: 0 x10E3/uL (ref 0.0–0.2)
Basos: 1 %
EOS (ABSOLUTE): 0.1 x10E3/uL (ref 0.0–0.4)
Eos: 1 %
Hematocrit: 45.4 % (ref 34.0–46.6)
Hemoglobin: 14.7 g/dL (ref 11.1–15.9)
Immature Grans (Abs): 0 x10E3/uL (ref 0.0–0.1)
Immature Granulocytes: 0 %
Lymphocytes Absolute: 1.2 x10E3/uL (ref 0.7–3.1)
Lymphs: 21 %
MCH: 27.3 pg (ref 26.6–33.0)
MCHC: 32.4 g/dL (ref 31.5–35.7)
MCV: 84 fL (ref 79–97)
Monocytes Absolute: 0.4 x10E3/uL (ref 0.1–0.9)
Monocytes: 7 %
Neutrophils Absolute: 4 x10E3/uL (ref 1.4–7.0)
Neutrophils: 70 %
Platelets: 285 x10E3/uL (ref 150–450)
RBC: 5.38 x10E6/uL — ABNORMAL HIGH (ref 3.77–5.28)
RDW: 12.4 % (ref 11.7–15.4)
WBC: 5.7 x10E3/uL (ref 3.4–10.8)

## 2024-10-31 LAB — CMP14+EGFR
ALT: 10 IU/L (ref 0–32)
AST: 16 IU/L (ref 0–40)
Albumin: 4.3 g/dL (ref 3.9–4.9)
Alkaline Phosphatase: 65 IU/L (ref 49–135)
BUN/Creatinine Ratio: 26 (ref 12–28)
BUN: 21 mg/dL (ref 8–27)
Bilirubin Total: 0.5 mg/dL (ref 0.0–1.2)
CO2: 22 mmol/L (ref 20–29)
Calcium: 9.2 mg/dL (ref 8.7–10.3)
Chloride: 107 mmol/L — ABNORMAL HIGH (ref 96–106)
Creatinine, Ser: 0.82 mg/dL (ref 0.57–1.00)
Globulin, Total: 1.8 g/dL (ref 1.5–4.5)
Glucose: 108 mg/dL — ABNORMAL HIGH (ref 70–99)
Potassium: 4.8 mmol/L (ref 3.5–5.2)
Sodium: 141 mmol/L (ref 134–144)
Total Protein: 6.1 g/dL (ref 6.0–8.5)
eGFR: 77 mL/min/1.73

## 2024-10-31 LAB — LIPID PANEL
Chol/HDL Ratio: 2.6 ratio (ref 0.0–4.4)
Cholesterol, Total: 173 mg/dL (ref 100–199)
HDL: 66 mg/dL
LDL Chol Calc (NIH): 94 mg/dL (ref 0–99)
Triglycerides: 70 mg/dL (ref 0–149)
VLDL Cholesterol Cal: 13 mg/dL (ref 5–40)

## 2024-10-31 LAB — TSH: TSH: 0.031 u[IU]/mL — ABNORMAL LOW (ref 0.450–4.500)

## 2024-10-31 LAB — VITAMIN D 25 HYDROXY (VIT D DEFICIENCY, FRACTURES): Vit D, 25-Hydroxy: 45.8 ng/mL (ref 30.0–100.0)

## 2024-10-31 NOTE — Telephone Encounter (Signed)
 VOB pending in [facility] portal.

## 2024-11-01 ENCOUNTER — Other Ambulatory Visit: Payer: Self-pay | Admitting: Internal Medicine

## 2024-11-01 DIAGNOSIS — F419 Anxiety disorder, unspecified: Secondary | ICD-10-CM

## 2024-11-01 NOTE — Telephone Encounter (Signed)
 VOB for Jubbonti still pending.

## 2024-11-03 NOTE — Progress Notes (Signed)
 Daveigh Batty                                          MRN: [id number]   11/03/2024   The [facility] Quality Team Specialist reviewed this patient medical record for the purposes of chart review for care gap closure. The following were reviewed: chart review for care gap closure-breast cancer screening.    [facility] Quality Team

## 2024-11-17 ENCOUNTER — Encounter: Attending: Family Medicine | Admitting: Emergency Medicine

## 2024-11-17 VITALS — BP 114/76 | HR 76 | Temp 98.0°F | Resp 16

## 2024-11-17 DIAGNOSIS — M810 Age-related osteoporosis without current pathological fracture: Secondary | ICD-10-CM

## 2024-11-17 MED ORDER — DENOSUMAB 60 MG/ML ~~LOC~~ SOSY
60.0000 mg | PREFILLED_SYRINGE | Freq: Once | SUBCUTANEOUS | Status: AC
  Administered 2024-11-17: 60 mg via SUBCUTANEOUS

## 2024-11-17 NOTE — Progress Notes (Signed)
 Diagnosis: Osteoporosis  Provider:  Joane Sar  Procedure: Injection  Prolia , Dose: 60 mg, Site: subcutaneous, Number of injections: 1  Injection Site(s): Left arm  Post Care: Patient declined observation  Discharge: Condition: Good, Destination: Home . AVS Declined  Performed by:  Delon ONEIDA Officer, RN

## 2024-11-17 NOTE — Telephone Encounter (Signed)
 Last Prolia  inj 11/17/24 Next Prolia  inj due 05/18/25

## 2025-01-22 ENCOUNTER — Ambulatory Visit

## 2025-05-01 ENCOUNTER — Ambulatory Visit: Payer: Self-pay | Admitting: Internal Medicine

## 2025-05-18 ENCOUNTER — Ambulatory Visit
